# Patient Record
Sex: Female | Born: 1965 | Race: White | Hispanic: No | Marital: Married | Smoking: Never smoker
Health system: Southern US, Community
[De-identification: ages and names within clinical notes are randomized; demographics above are authoritative.]

## PROBLEM LIST (undated history)

## (undated) DIAGNOSIS — G2581 Restless legs syndrome: Secondary | ICD-10-CM

## (undated) DIAGNOSIS — I10 Essential (primary) hypertension: Secondary | ICD-10-CM

## (undated) DIAGNOSIS — I493 Ventricular premature depolarization: Secondary | ICD-10-CM

## (undated) DIAGNOSIS — Z8742 Personal history of other diseases of the female genital tract: Secondary | ICD-10-CM

## (undated) DIAGNOSIS — G43909 Migraine, unspecified, not intractable, without status migrainosus: Secondary | ICD-10-CM

## (undated) DIAGNOSIS — M1710 Unilateral primary osteoarthritis, unspecified knee: Secondary | ICD-10-CM

## (undated) DIAGNOSIS — S43439A Superior glenoid labrum lesion of unspecified shoulder, initial encounter: Secondary | ICD-10-CM

## (undated) DIAGNOSIS — K5792 Diverticulitis of intestine, part unspecified, without perforation or abscess without bleeding: Secondary | ICD-10-CM

## (undated) DIAGNOSIS — Z973 Presence of spectacles and contact lenses: Secondary | ICD-10-CM

## (undated) DIAGNOSIS — K219 Gastro-esophageal reflux disease without esophagitis: Secondary | ICD-10-CM

## (undated) DIAGNOSIS — M7502 Adhesive capsulitis of left shoulder: Secondary | ICD-10-CM

## (undated) DIAGNOSIS — M179 Osteoarthritis of knee, unspecified: Secondary | ICD-10-CM

## (undated) DIAGNOSIS — R51 Headache: Secondary | ICD-10-CM

## (undated) HISTORY — PX: FOOT SURGERY: SHX648

## (undated) HISTORY — DX: Headache: R51

## (undated) HISTORY — DX: Essential (primary) hypertension: I10

---

## 2002-07-29 HISTORY — PX: SHOULDER ARTHROSCOPY W/ LABRAL REPAIR: SHX2399

## 2004-10-12 ENCOUNTER — Other Ambulatory Visit: Admission: RE | Admit: 2004-10-12 | Discharge: 2004-10-12 | Payer: Self-pay | Admitting: Family Medicine

## 2006-07-29 HISTORY — PX: LAPAROSCOPIC CHOLECYSTECTOMY: SUR755

## 2007-01-27 ENCOUNTER — Ambulatory Visit (HOSPITAL_COMMUNITY): Admission: RE | Admit: 2007-01-27 | Discharge: 2007-01-27 | Payer: Self-pay | Admitting: Family Medicine

## 2010-06-15 ENCOUNTER — Encounter: Admission: RE | Admit: 2010-06-15 | Discharge: 2010-06-15 | Payer: Self-pay | Admitting: Obstetrics & Gynecology

## 2010-06-26 ENCOUNTER — Encounter: Admission: RE | Admit: 2010-06-26 | Discharge: 2010-06-26 | Payer: Self-pay | Admitting: Obstetrics & Gynecology

## 2010-08-18 ENCOUNTER — Encounter: Payer: Self-pay | Admitting: Obstetrics & Gynecology

## 2010-08-20 ENCOUNTER — Emergency Department (HOSPITAL_BASED_OUTPATIENT_CLINIC_OR_DEPARTMENT_OTHER)
Admission: EM | Admit: 2010-08-20 | Discharge: 2010-08-20 | Payer: Self-pay | Source: Home / Self Care | Admitting: Emergency Medicine

## 2011-05-23 ENCOUNTER — Ambulatory Visit: Payer: BC Managed Care – PPO | Attending: Gynecologic Oncology | Admitting: Gynecologic Oncology

## 2011-05-23 DIAGNOSIS — E785 Hyperlipidemia, unspecified: Secondary | ICD-10-CM | POA: Insufficient documentation

## 2011-05-23 DIAGNOSIS — Z801 Family history of malignant neoplasm of trachea, bronchus and lung: Secondary | ICD-10-CM | POA: Insufficient documentation

## 2011-05-23 DIAGNOSIS — I10 Essential (primary) hypertension: Secondary | ICD-10-CM | POA: Insufficient documentation

## 2011-05-23 DIAGNOSIS — Z808 Family history of malignant neoplasm of other organs or systems: Secondary | ICD-10-CM | POA: Insufficient documentation

## 2011-05-23 DIAGNOSIS — N9489 Other specified conditions associated with female genital organs and menstrual cycle: Secondary | ICD-10-CM | POA: Insufficient documentation

## 2011-05-23 DIAGNOSIS — Z79899 Other long term (current) drug therapy: Secondary | ICD-10-CM | POA: Insufficient documentation

## 2011-05-24 NOTE — Consult Note (Signed)
Carol Hamilton, Carol Hamilton NO.:  [id number]  MEDICAL RECORD NO.:  [id number]  LOCATION:  GYN                          FACILITY:  [facility]  PHYSICIAN:  Laurette Schimke, MD     DATE OF BIRTH:  [birth year]  DATE OF CONSULTATION:  05/23/2011 DATE OF DISCHARGE:                                CONSULTATION   REASON FOR VISIT:  Left ovarian cyst.  HISTORY OF PRESENT ILLNESS:  This is a 45 year old, gravida 2, para 2, last normal menstrual period on April 29, 2011.  In 2001, she presented with significant abdominal pain and evaluation at that time was diagnosis of likely serosal fibroid.  At that time, the left ovary was noted to have an 11 x 10 mm smooth cyst and a 14 x 9 mm cyst with low- level echoes.  Repeat ultrasound on August 29, 2010, demonstrated a 12 x 14 x 15 mm left ovarian cyst with slight thickening echoes.  On April 24, 2011, repeat ultrasound of the same left ovary was notable for left adnexa measuring 31 x 38 x 41 mm, 29 x 22 x 25 mm smooth cyst with septations.  CA-125 returned a value of 24.5.  Carol Hamilton denies any nausea or vomiting, reports diarrhea for 7 months duration with some constipation over the last few months.  She reports good appetite.  No weight loss.  No hematochezia or abnormal uterine bleeding.  PAST MEDICAL HISTORY: 1. Hypertension for 10 years. 2. Premature ventricular contractions. 3. Hyperlipidemia, diagnosed 3 years ago. 4. Questionable trigeminal neuralgia, repeated with nonspecific workup     for MS.  PAST SURGICAL HISTORY:  Right shoulder surgery in 2003 and cholecystectomy in 2009.  PAST GYN HISTORY:  Menarche occurred at age of 36 with regular menses every 32 days.  She denies any history of abnormal Pap test.  She used oral contraceptive pills for 2 years in the early part of her sexually active life.  Her husband has had a vasectomy.  MEDICATIONS: 1. Omeprazole 40 mg daily. 2. Lisinopril and hydrochlorothiazide  10/12.5 mg daily. 3. Acebutolol 200 mg daily.  ALLERGIES:  AMOXICILLIN that causes emesis.  SULFA drug causes hives. ERYTHROMYCIN that causes hives and shortness of breath.  FAMILY HISTORY:  Paternal grandmother with lung cancer, diagnosed at age of 67 and a father with melanoma, diagnosed in the 52s.  REVIEW OF SYSTEMS:  No nausea, vomiting.  No hematuria, hematochezia, or hematuria.  Denies early satiety, increasing abdominal girth.  No abdominal distention or weight loss.  Otherwise, 10 point review of systems is negative.  PHYSICAL EXAMINATION:  GENERAL:  Well-developed female, in no acute distress.  VITAL SIGNS:  Weight 189.7 pounds, height 5 feet 9 inches, blood pressure 142/78, heart rate of 82, temperature of 98.4. CHEST:  Clear to auscultation. HEART:  Regular rate and rhythm. ABDOMEN:  Soft, nontender, obese without any palpable omental cake or palpable fluid wave. BACK:  No CVA tenderness. LYMPH NODE SURVEY:  No cervical, supraclavicular, inguinal adenopathy. PELVIC EXAMINATION:  Normal external genitalia, Bartholin's, urethra, and Skene's.  Uterus, cervix without any masses.  Uterus is mobile, retroverted.  No nodularity in the cul-de-sac.  No palpable pelvic masses.  No left  or right-sided tenderness. RECTAL EXAMINATION:  Good anal sphincter tone without any masses.  IMPRESSION:  Carol Hamilton is a 45 year old with a persistent left adnexal mass, slowly increasing in size over the last year.  She has normal CA-125.  This likely represents a benign neoplasm or an endometrioma.  I have recommended bilateral salpingectomy and left oophorectomy.  Carol Hamilton informed me that Dr. Leda Quail has requested that it be performed by the GYN Oncology Service.  As such, procedure will be performed on June 04, 2011.  Robotic approach will be taken to facilitate easy removal of the contralateral fallopian tube. A frozen section will be obtained, if malignancy is  identified, patient understands it will be an open laparotomy with completed staging inclusive, but not limited to omentectomy, lymph node dissection, bowel resection, or debulking.  Risks and benefits of the procedure were discussed with the patient and her husband and all of their questions were answered.     Laurette Schimke, MD     WB/MEDQ  D:  05/23/2011  T:  05/24/2011  Job:  [id number]  cc:   Telford Nab, R.N. [address] [place], [place] [address]  Joycelyn Rua, M.D. Fax: [phone]  Kerney Elbe, MD [facility]  Lum Keas, MD  Electronically Signed by Laurette Schimke MD on 05/24/2011 11:10:34 AM

## 2011-06-04 ENCOUNTER — Encounter (HOSPITAL_COMMUNITY): Payer: Self-pay | Admitting: Pharmacy Technician

## 2011-06-07 ENCOUNTER — Encounter (HOSPITAL_COMMUNITY): Payer: BC Managed Care – PPO

## 2011-06-07 ENCOUNTER — Encounter (HOSPITAL_COMMUNITY): Payer: Self-pay

## 2011-06-07 ENCOUNTER — Ambulatory Visit (HOSPITAL_COMMUNITY)
Admission: RE | Admit: 2011-06-07 | Discharge: 2011-06-07 | Disposition: A | Discharge status: Home / Self Care | Payer: BC Managed Care – PPO | Source: Ambulatory Visit | Attending: Gynecologic Oncology | Admitting: Gynecologic Oncology

## 2011-06-07 ENCOUNTER — Other Ambulatory Visit: Payer: Self-pay

## 2011-06-07 DIAGNOSIS — Z01818 Encounter for other preprocedural examination: Secondary | ICD-10-CM | POA: Insufficient documentation

## 2011-06-07 DIAGNOSIS — I10 Essential (primary) hypertension: Secondary | ICD-10-CM | POA: Insufficient documentation

## 2011-06-07 DIAGNOSIS — R9431 Abnormal electrocardiogram [ECG] [EKG]: Secondary | ICD-10-CM | POA: Insufficient documentation

## 2011-06-07 DIAGNOSIS — Z01810 Encounter for preprocedural cardiovascular examination: Secondary | ICD-10-CM | POA: Insufficient documentation

## 2011-06-07 DIAGNOSIS — Z01812 Encounter for preprocedural laboratory examination: Secondary | ICD-10-CM | POA: Insufficient documentation

## 2011-06-07 LAB — CBC
HCT: 38 % (ref 36.0–46.0)
MCH: 31.3 pg (ref 26.0–34.0)
MCHC: 34.2 g/dL (ref 30.0–36.0)
MCV: 91.6 fL (ref 78.0–100.0)
Platelets: 328 10*3/uL (ref 150–400)
RDW: 12.4 % (ref 11.5–15.5)
WBC: 9.4 10*3/uL (ref 4.0–10.5)

## 2011-06-07 LAB — COMPREHENSIVE METABOLIC PANEL
AST: 16 U/L (ref 0–37)
Albumin: 4.5 g/dL (ref 3.5–5.2)
BUN: 13 mg/dL (ref 6–23)
Calcium: 10.5 mg/dL (ref 8.4–10.5)
Creatinine, Ser: 0.69 mg/dL (ref 0.50–1.10)
Total Bilirubin: 0.5 mg/dL (ref 0.3–1.2)
Total Protein: 7.6 g/dL (ref 6.0–8.3)

## 2011-06-07 LAB — DIFFERENTIAL
Basophils Relative: 0 % (ref 0–1)
Lymphocytes Relative: 27 % (ref 12–46)
Monocytes Absolute: 0.8 10*3/uL (ref 0.1–1.0)
Monocytes Relative: 8 % (ref 3–12)
Neutro Abs: 6 10*3/uL (ref 1.7–7.7)
Neutrophils Relative %: 64 % (ref 43–77)

## 2011-06-07 NOTE — Patient Instructions (Addendum)
20 Carol Hamilton  06/07/2011   Your procedure is scheduled on: 06-11-11  Report to [facility] at 12:00 noon.  Call this number if you have problems the morning of surgery: [phone]   Remember:   Do not eat food:After Midnight.06-10-11  Do not drink clear liquids: 6 Hours before arrival.(clear liquids until 08:00am 06-11-11  Take these medicines the morning of surgery with A SIP OF WATER: none-Take Acebutelol the night before at bedtime.  Do not wear jewelry, make-up or nail polish.  Do not wear lotions, powders, or perfumes. You may wear deodorant.  Do not shave 48 hours prior to surgery.  Do not bring valuables to the hospital.  Contacts, dentures or bridgework may not be worn into surgery.  Leave suitcase in the car. After surgery it may be brought to your room.  For patients admitted to the hospital, checkout time is 11:00 AM the day of discharge.   Patients discharged the day of surgery will not be allowed to drive home.  Name and phone number of your driver: Henslee Lottman [phone]  Special Instructions: CHG Shower Use Special Wash: 1/2 bottle night before surgery and 1/2 bottle morning of surgery.   Please read over the following fact sheets that you were given: Blood Transfusion Information and MRSA Information. Follow Clear Liquids X 24 hours prior to surgery.

## 2011-06-07 NOTE — Pre-Procedure Instructions (Signed)
06-07-11-Reports not yet received from [facility] for EKG. EKG and CXR done today. Pt. Aware clot/hold to be done Am of surgery.

## 2011-06-11 ENCOUNTER — Encounter (HOSPITAL_COMMUNITY): Payer: Self-pay | Admitting: *Deleted

## 2011-06-11 ENCOUNTER — Encounter (HOSPITAL_COMMUNITY): Payer: Self-pay | Admitting: Certified Registered Nurse Anesthetist

## 2011-06-11 ENCOUNTER — Ambulatory Visit (HOSPITAL_COMMUNITY)
Admission: RE | Admit: 2011-06-11 | Discharge: 2011-06-11 | Disposition: A | Discharge status: Home / Self Care | Payer: BC Managed Care – PPO | Source: Ambulatory Visit | Attending: Obstetrics & Gynecology | Admitting: Obstetrics & Gynecology

## 2011-06-11 ENCOUNTER — Encounter (HOSPITAL_COMMUNITY)
Admission: RE | Disposition: A | Discharge status: Home / Self Care | Payer: Self-pay | Source: Ambulatory Visit | Attending: Obstetrics & Gynecology

## 2011-06-11 ENCOUNTER — Ambulatory Visit (HOSPITAL_COMMUNITY): Payer: BC Managed Care – PPO | Admitting: Certified Registered Nurse Anesthetist

## 2011-06-11 ENCOUNTER — Other Ambulatory Visit: Payer: Self-pay | Admitting: Gynecologic Oncology

## 2011-06-11 DIAGNOSIS — I10 Essential (primary) hypertension: Secondary | ICD-10-CM | POA: Insufficient documentation

## 2011-06-11 DIAGNOSIS — E785 Hyperlipidemia, unspecified: Secondary | ICD-10-CM | POA: Insufficient documentation

## 2011-06-11 DIAGNOSIS — I4949 Other premature depolarization: Secondary | ICD-10-CM | POA: Insufficient documentation

## 2011-06-11 DIAGNOSIS — N839 Noninflammatory disorder of ovary, fallopian tube and broad ligament, unspecified: Secondary | ICD-10-CM | POA: Insufficient documentation

## 2011-06-11 DIAGNOSIS — N801 Endometriosis of ovary: Secondary | ICD-10-CM | POA: Insufficient documentation

## 2011-06-11 DIAGNOSIS — N8000 Endometriosis of the uterus, unspecified: Secondary | ICD-10-CM | POA: Insufficient documentation

## 2011-06-11 DIAGNOSIS — Z79899 Other long term (current) drug therapy: Secondary | ICD-10-CM | POA: Insufficient documentation

## 2011-06-11 DIAGNOSIS — K660 Peritoneal adhesions (postprocedural) (postinfection): Secondary | ICD-10-CM | POA: Insufficient documentation

## 2011-06-11 DIAGNOSIS — N80109 Endometriosis of ovary, unspecified side, unspecified depth: Secondary | ICD-10-CM | POA: Insufficient documentation

## 2011-06-11 HISTORY — PX: OTHER SURGICAL HISTORY: SHX169

## 2011-06-11 LAB — SAMPLE TO BLOOD BANK

## 2011-06-11 SURGERY — ROBOTIC ASSISTED BILATERAL SALPINGO OOPHORECTOMY
Anesthesia: General | Site: Abdomen | Laterality: Right | Wound class: Clean

## 2011-06-11 MED ORDER — GLYCOPYRROLATE 0.2 MG/ML IJ SOLN
INTRAMUSCULAR | Status: DC | PRN
  Administered 2011-06-11: .5 mg via INTRAVENOUS

## 2011-06-11 MED ORDER — LACTATED RINGERS IV SOLN
INTRAVENOUS | Status: DC | PRN
  Administered 2011-06-11 (×2): via INTRAVENOUS

## 2011-06-11 MED ORDER — LACTATED RINGERS IV SOLN: INTRAVENOUS | Status: DC

## 2011-06-11 MED ORDER — STERILE WATER FOR IRRIGATION IR SOLN
Status: DC | PRN
  Administered 2011-06-11: 2000 mL

## 2011-06-11 MED ORDER — HYDROMORPHONE HCL PF 1 MG/ML IJ SOLN
INTRAMUSCULAR | Status: DC | PRN
  Administered 2011-06-11 (×4): .4 mg via INTRAVENOUS

## 2011-06-11 MED ORDER — OXYCODONE-ACETAMINOPHEN 5-325 MG PO TABS: 2.0000 | ORAL_TABLET | Freq: Four times a day (QID) | ORAL | Status: AC | PRN

## 2011-06-11 MED ORDER — LACTATED RINGERS IR SOLN
Status: DC | PRN
  Administered 2011-06-11: 1000 mL

## 2011-06-11 MED ORDER — LIDOCAINE HCL (CARDIAC) 20 MG/ML IV SOLN
INTRAVENOUS | Status: DC | PRN
  Administered 2011-06-11: 80 mg via INTRAVENOUS

## 2011-06-11 MED ORDER — MIDAZOLAM HCL 5 MG/5ML IJ SOLN
INTRAMUSCULAR | Status: DC | PRN
  Administered 2011-06-11: 2 mg via INTRAVENOUS

## 2011-06-11 MED ORDER — ROCURONIUM BROMIDE 100 MG/10ML IV SOLN
INTRAVENOUS | Status: DC | PRN
  Administered 2011-06-11: 40 mg via INTRAVENOUS
  Administered 2011-06-11: 10 mg via INTRAVENOUS

## 2011-06-11 MED ORDER — DEXAMETHASONE SODIUM PHOSPHATE 10 MG/ML IJ SOLN
INTRAMUSCULAR | Status: DC | PRN
  Administered 2011-06-11: 10 mg via INTRAVENOUS

## 2011-06-11 MED ORDER — HYDROMORPHONE HCL PF 1 MG/ML IJ SOLN
0.2500 mg | INTRAMUSCULAR | Status: DC | PRN
  Administered 2011-06-11: 0.5 mg via INTRAVENOUS

## 2011-06-11 MED ORDER — ACETAMINOPHEN 10 MG/ML IV SOLN
INTRAVENOUS | Status: DC | PRN
  Administered 2011-06-11: 1000 mg via INTRAVENOUS

## 2011-06-11 MED ORDER — NEOSTIGMINE METHYLSULFATE 1 MG/ML IJ SOLN
INTRAMUSCULAR | Status: DC | PRN
  Administered 2011-06-11: 3.5 mg via INTRAVENOUS

## 2011-06-11 MED ORDER — PROPOFOL 10 MG/ML IV EMUL
INTRAVENOUS | Status: DC | PRN
  Administered 2011-06-11: 150 mg via INTRAVENOUS

## 2011-06-11 MED ORDER — ONDANSETRON HCL 4 MG/2ML IJ SOLN
INTRAMUSCULAR | Status: DC | PRN
  Administered 2011-06-11: 4 mg via INTRAVENOUS

## 2011-06-11 MED ORDER — PROMETHAZINE HCL 25 MG/ML IJ SOLN
6.2500 mg | INTRAMUSCULAR | Status: DC | PRN
  Administered 2011-06-11: 6.25 mg via INTRAVENOUS

## 2011-06-11 MED ORDER — DEXTROSE 5 % IV SOLN
1.0000 g | INTRAVENOUS | Status: DC | PRN
  Administered 2011-06-11: 2 g via INTRAVENOUS

## 2011-06-11 MED ORDER — FENTANYL CITRATE 0.05 MG/ML IJ SOLN
INTRAMUSCULAR | Status: DC | PRN
  Administered 2011-06-11: 50 ug via INTRAVENOUS
  Administered 2011-06-11: 100 ug via INTRAVENOUS
  Administered 2011-06-11 (×2): 50 ug via INTRAVENOUS

## 2011-06-11 SURGICAL SUPPLY — 55 items
APL SKNCLS STERI-STRIP NONHPOA (GAUZE/BANDAGES/DRESSINGS) ×2
BAG SPEC RTRVL LRG 6X4 10 (ENDOMECHANICALS) ×2
BENZOIN TINCTURE PRP APPL 2/3 (GAUZE/BANDAGES/DRESSINGS) ×3 IMPLANT
CHLORAPREP W/TINT 26ML (MISCELLANEOUS) ×3 IMPLANT
CLOTH BEACON ORANGE TIMEOUT ST (SAFETY) ×3 IMPLANT
CORDS BIPOLAR (ELECTRODE) ×3 IMPLANT
COVER MAYO STAND STRL (DRAPES) ×3 IMPLANT
COVER SURGICAL LIGHT HANDLE (MISCELLANEOUS) ×3 IMPLANT
COVER TIP SHEARS 8 DVNC (MISCELLANEOUS) ×2 IMPLANT
COVER TIP SHEARS 8MM DA VINCI (MISCELLANEOUS) ×1
DECANTER SPIKE VIAL GLASS SM (MISCELLANEOUS) ×2 IMPLANT
DRAPE LG THREE QUARTER DISP (DRAPES) ×6 IMPLANT
DRAPE SURG IRRIG POUCH 19X23 (DRAPES) ×3 IMPLANT
DRAPE TABLE BACK 44X90 PK DISP (DRAPES) ×5 IMPLANT
DRAPE UTILITY XL STRL (DRAPES) ×3 IMPLANT
DRAPE WARM FLUID 44X44 (DRAPE) ×3 IMPLANT
DRSG TEGADERM 6X8 (GAUZE/BANDAGES/DRESSINGS) ×6 IMPLANT
ELECT REM PT RETURN 9FT ADLT (ELECTROSURGICAL) ×3
ELECTRODE REM PT RTRN 9FT ADLT (ELECTROSURGICAL) ×2 IMPLANT
GAUZE VASELINE 3X9 (GAUZE/BANDAGES/DRESSINGS) IMPLANT
GLOVE BIO SURGEON STRL SZ 6.5 (GLOVE) ×12 IMPLANT
GLOVE BIO SURGEON STRL SZ7.5 (GLOVE) ×6 IMPLANT
GLOVE BIOGEL PI IND STRL 7.0 (GLOVE) ×4 IMPLANT
GLOVE BIOGEL PI INDICATOR 7.0 (GLOVE) ×2
GOWN PREVENTION PLUS XLARGE (GOWN DISPOSABLE) ×15 IMPLANT
HOLDER FOLEY CATH W/STRAP (MISCELLANEOUS) ×3 IMPLANT
KIT ACCESSORY DA VINCI DISP (KITS) ×1
KIT ACCESSORY DVNC DISP (KITS) ×2 IMPLANT
LUBRICANT JELLY K Y 4OZ (MISCELLANEOUS) ×3 IMPLANT
MANIPULATOR UTERINE 4.5 ZUMI (MISCELLANEOUS) ×2 IMPLANT
OCCLUDER COLPOPNEUMO (BALLOONS) ×2 IMPLANT
PACK LAPAROSCOPY W LONG (CUSTOM PROCEDURE TRAY) ×3 IMPLANT
PENCIL BUTTON HOLSTER BLD 10FT (ELECTRODE) ×2 IMPLANT
POSITIONER SURGICAL ARM (MISCELLANEOUS) ×6 IMPLANT
POUCH SPECIMEN RETRIEVAL 10MM (ENDOMECHANICALS) ×5 IMPLANT
SET TUBE IRRIG SUCTION NO TIP (IRRIGATION / IRRIGATOR) ×3 IMPLANT
SHEET LAVH (DRAPES) ×3 IMPLANT
SOLUTION ELECTROLUBE (MISCELLANEOUS) ×3 IMPLANT
SPONGE LAP 18X18 X RAY DECT (DISPOSABLE) IMPLANT
STRIP CLOSURE SKIN 1/2X4 (GAUZE/BANDAGES/DRESSINGS) ×3 IMPLANT
SUT VIC AB 0 CT1 27 (SUTURE)
SUT VIC AB 0 CT1 27XBRD ANTBC (SUTURE) ×6 IMPLANT
SUT VIC AB 4-0 PS2 27 (SUTURE) ×6 IMPLANT
SUT VICRYL 0 UR6 27IN ABS (SUTURE) ×5 IMPLANT
SYR 50ML LL SCALE MARK (SYRINGE) ×3 IMPLANT
SYR BULB IRRIGATION 50ML (SYRINGE) IMPLANT
SYRINGE 10CC LL (SYRINGE) ×6 IMPLANT
TOWEL OR 17X26 10 PK STRL BLUE (TOWEL DISPOSABLE) ×8 IMPLANT
TRAP SPECIMEN MUCOUS 40CC (MISCELLANEOUS) ×3 IMPLANT
TRAY FOLEY CATH 14FRSI W/METER (CATHETERS) ×3 IMPLANT
TROCAR 12M 150ML BLUNT (TROCAR) ×3 IMPLANT
TROCAR BLADELESS OPT 5 100 (ENDOMECHANICALS) ×3 IMPLANT
TROCAR XCEL 12X100 BLDLESS (ENDOMECHANICALS) ×3 IMPLANT
TUBING FILTER THERMOFLATOR (ELECTROSURGICAL) ×3 IMPLANT
WATER STERILE IRR 1500ML POUR (IV SOLUTION) ×6 IMPLANT

## 2011-06-11 NOTE — Anesthesia Preprocedure Evaluation (Addendum)
Anesthesia Evaluation  Patient identified by MRN, date of birth, ID band Patient awake    Reviewed: Allergy & Precautions, H&P , NPO status , Patient's Chart, lab work & pertinent test results, reviewed documented beta blocker date and time   Airway Mallampati: II TM Distance: >3 FB Neck ROM: Full    Dental   Pulmonary neg pulmonary ROS,  clear to auscultation        Cardiovascular hypertension, Pt. on medications Regular Normal Denies cardiac symptoms, except for occasional PVC   Neuro/Psych Negative Neurological ROS  Negative Psych ROS   GI/Hepatic negative GI ROS, Neg liver ROS,   Endo/Other  Negative Endocrine ROS  Renal/GU negative Renal ROS  Genitourinary negative   Musculoskeletal negative musculoskeletal ROS (+)   Abdominal   Peds negative pediatric ROS (+)  Hematology negative hematology ROS (+)   Anesthesia Other Findings   Reproductive/Obstetrics negative OB ROS                          Anesthesia Physical Anesthesia Plan  ASA: II  Anesthesia Plan: General   Post-op Pain Management:    Induction:   Airway Management Planned: Oral ETT  Additional Equipment:   Intra-op Plan:   Post-operative Plan: Extubation in OR  Informed Consent: I have reviewed the patients History and Physical, chart, labs and discussed the procedure including the risks, benefits and alternatives for the proposed anesthesia with the patient or authorized representative who has indicated his/her understanding and acceptance.     Plan Discussed with: CRNA and Surgeon  Anesthesia Plan Comments:         Anesthesia Quick Evaluation

## 2011-06-11 NOTE — Preoperative (Addendum)
Beta Blockers   Reason not to administer Beta Blockers:  Pt took Sectral 06-10-11 at 2100

## 2011-06-11 NOTE — Anesthesia Postprocedure Evaluation (Signed)
  Anesthesia Post-op Note  Patient: Carol Hamilton  Procedure(s) Performed:  ROBOTIC ASSISTED SALPINGO OOPHERECTOMY; UNILATERAL SALPINGECTOMY  Patient Location: PACU  Anesthesia Type: General  Level of Consciousness: oriented and sedated  Airway and Oxygen Therapy: Patient Spontanous Breathing and Patient connected to nasal cannula oxygen  Post-op Pain: mild  Post-op Assessment: Post-op Vital signs reviewed, Patient's Cardiovascular Status Stable, Respiratory Function Stable and Patent Airway  Post-op Vital Signs: stable  Complications: No apparent anesthesia complications

## 2011-06-11 NOTE — H&P (Signed)
NAMEBRIXTON, Carol Hamilton NO.: [id number]  MEDICAL RECORD NO.: [id number]  LOCATION: GYN FACILITY: [facility]  DATE OF BIRTH: 1966/06/18    CONSULTATION  REASON FOR VISIT: Left ovarian cyst.  HISTORY OF PRESENT ILLNESS: This is a 45 year old, gravida 2, para 2,  last normal menstrual period on April 29, 2011. In 2001, she presented  with significant abdominal pain and evaluation at that time was  diagnosis of likely serosal fibroid. At that time, the left ovary was  noted to have an 11 x 10 mm smooth cyst and a 14 x 9 mm cyst with low-  level echoes. Repeat ultrasound on August 29, 2010, demonstrated a 12  x 14 x 15 mm left ovarian cyst with slight thickening echoes. On  April 24, 2011, repeat ultrasound of the same left ovary was notable  for left adnexa measuring 31 x 38 x 41 mm, 29 x 22 x 25 mm smooth cyst  with septations. CA-125 returned a value of 24.5. Carol Hamilton denies  any nausea or vomiting, reports diarrhea for 7 months duration with some  constipation over the last few months. She reports good appetite. No  weight loss. No hematochezia or abnormal uterine bleeding.   PAST MEDICAL HISTORY:  1. Hypertension for 10 years.  2. Premature ventricular contractions.  3. Hyperlipidemia, diagnosed 3 years ago.  4. Questionable trigeminal neuralgia, repeated with nonspecific workup  for MS.   PAST SURGICAL HISTORY: Right shoulder surgery in 2003 and  cholecystectomy in 2009.   PAST GYN HISTORY: Menarche occurred at age of 45 with regular menses  every 32 days. She denies any history of abnormal Pap test. She used  oral contraceptive pills for 2 years in the early part of her sexually  active life. Her husband has had a vasectomy.   MEDICATIONS:  1. Omeprazole 40 mg daily.  2. Lisinopril and hydrochlorothiazide 10/12.5 mg daily.  3. Acebutolol 200 mg daily.   ALLERGIES: AMOXICILLIN that causes emesis. SULFA drug causes hives.  ERYTHROMYCIN that causes hives and  shortness of breath.   FAMILY HISTORY: Paternal grandmother with lung cancer, diagnosed at age  of 55 and a father with melanoma, diagnosed in the 15s.   REVIEW OF SYSTEMS: No nausea, vomiting. No hematuria, hematochezia, or  hematuria. Denies early satiety, increasing abdominal girth. No  abdominal distention or weight loss. Otherwise, 10 point review of  systems is negative.   IMPRESSION: Carol Hamilton is a 45 year old with a persistent  left adnexal mass, slowly increasing in size over the last year. She  has normal CA-125. This likely represents a benign neoplasm or an  endometrioma.Robotic approach will  be taken to facilitate easy removal of the contralateral fallopian tube.  A frozen section will be obtained, if malignancy is identified,  patient understands it will be an open laparotomy with completed staging  inclusive, but not limited to omentectomy, lymph node dissection, bowel  resection, or debulking. Risks and benefits of the procedure were  discussed with the patient and her husband and all of their questions  were answered.    Addendum: Patient seen and examined.  All questions were answered.  Patient understands the most usual risks and benefits of the procedure and wishes to proceed.

## 2011-06-11 NOTE — Transfer of Care (Signed)
Immediate Anesthesia Transfer of Care Note  Patient: Carol Hamilton  Procedure(s) Performed:  ROBOTIC ASSISTED SALPINGO OOPHERECTOMY; UNILATERAL SALPINGECTOMY  Patient Location: PACU  Anesthesia Type: General  Level of Consciousness: alert  and sedated  Airway & Oxygen Therapy: Patient Spontanous Breathing and Patient connected to face mask oxygen  Post-op Assessment: Report given to PACU RN and Post -op Vital signs reviewed and stable  Post vital signs: Reviewed and stable  Complications: No apparent anesthesia complications

## 2011-06-11 NOTE — Anesthesia Procedure Notes (Addendum)
Spinal   Spinal    

## 2011-06-11 NOTE — Interval H&P Note (Signed)
History and Physical Interval Note:   06/11/2011   1:00 PM   Carol Hamilton  has presented today for surgery, with the diagnosis of Left Axilla Mass  The various methods of treatment have been discussed with the patient and family. After consideration of risks, benefits and other options for treatment, the patient has consented to  Procedure(s): ROBOTIC ASSISTED SALPINGO OOPHERECTOMY UNILATERAL SALPINGECTOMY as a surgical intervention .  The patients' history has been reviewed, patient examined, no change in status, stable for surgery.  I have reviewed the patients' chart and labs.  Questions were answered to the patient's satisfaction.     Cleda Mccreedy A.  MD

## 2011-06-11 NOTE — Op Note (Signed)
PATIENT: Carol Hamilton DATE OF BIRTH: 03-17-1966 ENCOUNTER DATE: 06/11/11   Preop Diagnosis: Left adnexal mass  Postoperative Diagnosis: Endometrioma  Surgery: Left salpingo-oophorectomy, right salpingectomy  Surgeon's Santo Zahradnik A. Duard Brady, MD; Antionette Char, MD   Assistant Telford Nab   Anesthesia: General   Estimated blood loss: 50 ml   IVF: 1400 ml   Urine output: 100 ml   Complications: None  Pathology:  bilateral tubes and left ovary ovaries  Operative findings: Uterus with 2 cm myoma. Evidence of endometriosis with a left sided ovarian mass. 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 Frozen section revealed endometriosis.  Procedure: The patient was identified in the preoperative holding area. Informed consent was signed on the chart. Patient was seen history was reviewed and exam was performed.  The patient was then taken to the operating room and placed in the supine position with SCD hose on. General anesthesia was then induced without difficulty. She was then placed in the dorsolithotomy position. Her arms were tucked at her side with appropriate precautions on the gel pad. Shoulder blocks were placed with all of the appropriate precautions. A OG-tube was placed to suction. First timeout was performed to confirm the patient procedure antibiotic allergy status, estimated estimated blood loss and OR time. The perineum was then prepped in the usual fashion with Betadine. A 14 French Foley was inserted into the bladder under sterile conditions. A sterile speculum was placed in the vagina. The cervix was without lesions. The cervix was grasped with a single-tooth tenaculum and a Hulka tenaculum was placed. The abdomen was then prepped with 2 Chlor prep sponges per protocol.  Patient was then draped after the prep was dried. Second timeout was performed to confirm the above. After again confirming OG tube placement and it was to suction.   A stab-wound was  made in left upper quadrant 2 cm below the costal margin on the left in the midclavicular line. A 5 mm operative report was used to assure intra-abdominal placement. The abdomen was insufflated. At this point all points during the procedure the patient's intra-abdominal pressure was not increased over 15 mm of mercury. After insufflation was complete, the patient was placed in deep Trendelenburg position. 25 cm above the pubic symphysis that area was marked the camera port. Bilateral robotic ports were marked 10 cm from the midline incision at approximately 5 angle. Under direct visualization each of the trochars was placed into the abdomen. The small bowel was folded on its mesentery to allow visualization to the pelvis. After assuring adequate visualization, the robot was then talked in the usual fashion. Under direct visualization the robotic instruments replaced.  The adhesive disease of the left adnexa to the rectosigmoid colon was taken down using meticulous sharp dissection.   The posterior leaf of the broad ligament were then taken down in the usual fashion. The ureter was identified on the medial leaf of the broad ligament. A window was made between [facility] and the ureter. The IP was coagulated with bipolar cautery and transected. We then dissected the peritoneum off the ureter which was inferior to this area where the ovary was stuck to the ovarian fossa. The fallopian tube was coagulated and transected at the level of the cornea. The utero-ovarian was coagulated with bipolar cautery and transected. The left adnexa was then placed an Endo catch bag and delivered through the 10/12 left upper quadrant port. Our attention was then drawn to the right fallopian tube. A right salpingectomy was performed  by coming across any hydrosalpinx with monopolar bipolar cautery were necessary. The fallopian tube was then delivered without difficulty. Frozen section at this time returned as endometriosis. Based on our  preoperative discussions with the patient would terminate the procedure at this point the The abdomen and pelvis were copiously irrigated and noted to be hemostatic. The robotic instruments were removed under direct visualization as were the robotic trochars. The pneumoperitoneum was removed. The patient was then taken out of the Trendelenburg position. Using a 0 Vicryl on a UR 6 needle the midline port port fascia was closed  And the left upper quadrant ports were reapproximated. The skin was closed using 4-0 Vicryl. Steri-Strips and benzoin were applied. A Hulka tenaculum was then removed from the cervix.  All instrument needle and Ray-Tec counts were correct x2. The patient tolerated the procedure well and was taken to the recovery room in stable condition. This is Carol Hamilton dictating an operative note on patient Carol Hamilton.

## 2011-06-13 ENCOUNTER — Telehealth: Payer: Self-pay | Admitting: *Deleted

## 2011-06-17 NOTE — Telephone Encounter (Signed)
Opened in error

## 2011-07-10 ENCOUNTER — Encounter: Payer: Self-pay | Admitting: Gynecologic Oncology

## 2011-07-10 ENCOUNTER — Ambulatory Visit: Payer: BC Managed Care – PPO | Attending: Gynecologic Oncology | Admitting: Gynecologic Oncology

## 2011-07-10 VITALS — BP 130/86 | HR 64 | Temp 97.9°F | Resp 16 | Ht 70.28 in | Wt 190.8 lb

## 2011-07-10 DIAGNOSIS — I10 Essential (primary) hypertension: Secondary | ICD-10-CM | POA: Insufficient documentation

## 2011-07-10 DIAGNOSIS — N80109 Endometriosis of ovary, unspecified side, unspecified depth: Secondary | ICD-10-CM | POA: Insufficient documentation

## 2011-07-10 DIAGNOSIS — R1013 Epigastric pain: Secondary | ICD-10-CM | POA: Insufficient documentation

## 2011-07-10 DIAGNOSIS — Z79899 Other long term (current) drug therapy: Secondary | ICD-10-CM | POA: Insufficient documentation

## 2011-07-10 DIAGNOSIS — Z09 Encounter for follow-up examination after completed treatment for conditions other than malignant neoplasm: Secondary | ICD-10-CM | POA: Insufficient documentation

## 2011-07-10 DIAGNOSIS — N801 Endometriosis of ovary: Secondary | ICD-10-CM

## 2011-07-10 DIAGNOSIS — Z9079 Acquired absence of other genital organ(s): Secondary | ICD-10-CM | POA: Insufficient documentation

## 2011-07-10 NOTE — Patient Instructions (Signed)
Followup with Dr. Hyacinth Meeker and Dr. Izola Price when necessary

## 2011-07-10 NOTE — Progress Notes (Signed)
Consult Note: Gyn-Onc  Carol Hamilton 45 y.o. female  CC:  Chief Complaint  Patient presents with  . Follow-up    adnexal mass    HPI: December a pleasant 45 year old who was seen by Dr. Nelly Rout in consultation at the request of Dr. Hyacinth Meeker. She was noted to have a 12 x 14 x 15 mm left ovarian cyst with some slight thickening echoes on ultrasound in February of 2012. A repeat on the same left ovary was notable for left adnexa measuring 3.1 x 3.8 x 4.1 cm. The smooth cysts with septations. Her CA 125 is normal for 24.5. Patient at that time reported diarrhea for about 7 months duration with some constipation for the past several months. She reported a good appetite. She denies any weight loss. Discussion by Dr. Nelly Rout and the patient had been to proceed with bilateral salpingectomy and left oophorectomy. The gross to keep the contralateral ovary for all possible. The patient subsequently underwent surgery on November 13 of year 2012.  Operative findings included uterus with a 2 cm myoma. There is evidence of endometriosis with a left sided ovarian mass. 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 Frozen section revealed endometriosis. Her right ovary was unremarkable. Pathology revealed on the left side ovarian tissue of endometriosis and endometriotic cyst. There was associated hemorrhage and hemosiderin deposition. There was no evidence of atypia or malignancy. The bilateral fallopian tubes were negative. She comes in today for a postoperative check.  She states her cycles are regular she is on her. Now they're coming every 25 days. She does complain of some hot flashes with no night sweats. She states that prior to surgery she had an episode that when she would Valsalva to have a bowel movement she experienced severe epigastric pain. This has happened twice since his surgery. Her bowels have been irregular. It is primarily this pain when she is constipated she needs to  Valsalva. She did see the PA her primary care physician's office prior surgery for this in a probiotic was recommended as well as a stool softener.  She states that with this most recent episode she experienced a lot of burping she's not sure if she experienced perpendicular to episodes. She does not complain of any heartburn. She's wondering if she has a hiatal hernia. She continues to take her Prilosec.  Interval History: As above  Review of Systems  Current Meds:  Outpatient Encounter Prescriptions as of 07/10/2011  Medication Sig Dispense Refill  . acebutolol (SECTRAL) 200 MG capsule Take 200 mg by mouth at bedtime.       Marland Kitchen guaiFENesin (MUCINEX) 600 MG 12 hr tablet Take 1,200 mg by mouth 2 (two) times daily as needed. Congestion        . lisinopril-hydrochlorothiazide (PRINZIDE,ZESTORETIC) 10-12.5 MG per tablet Take 1 tablet by mouth at bedtime.       Marland Kitchen omeprazole (PRILOSEC) 20 MG capsule Take 20 mg by mouth daily.       . rizatriptan (MAXALT-MLT) 10 MG disintegrating tablet Take 10 mg by mouth as needed. May repeat in 2 hours if needed..headache         Allergy:  Allergies  Allergen Reactions  . Amoxicillin Nausea And Vomiting  . Azithromycin Swelling and Other (See Comments)    Skin peeled  . Sulfa Antibiotics Rash    Social Hx:   History   Social History  . Marital Status: Married    Spouse Name: N/A    Number of Children:  N/A  . Years of Education: N/A   Occupational History  . Not on file.   Social History Main Topics  . Smoking status: Never Smoker   . Smokeless tobacco: Not on file  . Alcohol Use: 0.6 oz/week    1 Glasses of wine per week  . Drug Use:   . Sexually Active: Yes     Spouse with Vasectomy   Other Topics Concern  . Not on file   Social History Narrative  . No narrative on file    Past Surgical Hx:  Past Surgical History  Procedure Date  . Cholecystectomy 06-07-11     [facility]ap.[facility]  . Shoulder arthroscopy w/ labral repair  06-07-11    [name]  . Foot surgery 06-07-11    FBO-removed needle left foot-child  . Salpingectomy     Bilat tubes and left ovary removal    Past Medical Hx:  Past Medical History  Diagnosis Date  . Dysrhythmia 06-07-11    Hx. freq. PVC's -tx. Acebuterol  . Heart murmur 06-07-11    child, not now  . Hypertension   . Headache 06-07-11    Migraines-more hormonal  . Depression 06-07-11    in past-no issues now.  . Neurologic complaint, functional 06-07-11    Past symptoms of left sided severe head pain/tingling-no true dx. given-/not occurred in some time now.    Family Hx:  Family History  Problem Relation Age of Onset  . Anesthesia problems Sister     Vitals:  Blood pressure 130/86, pulse 64, temperature 97.9 F (36.6 C), temperature source Oral, resp. rate 16, last menstrual period 07/07/2011.  Physical Exam:  Well-nourished well-developed female in no acute distress.  Abdomen: Well-healed surgical incisions. Abdomen is soft nontender nondistended there are no palpable masses or prostatomegaly.  Pelvic: Deferred at patient's request as she is on her period. Assessment/Plan: 45 year old with endometriotic cysts and she is status post left salpingo-oophorectomy and right salpingectomy. She is having some epigastric pain that occurs primarily when she has to Valsalva for bowel movement. She thinks she may have a hiatal hernia. She has not discussed this with her primary care physician as of yet. She did discuss some of the pain issues with his PA and they recommended a probiotic and a stool softener. She will take responsibility following up with Dr. Lenise Arena. Discussed that he may recommend a CT scan to evaluate for hiatal hernia. We do look at her chest x-ray that she had preoperatively and there is no large amount of stomach in her chest to make an obvious diagnosis. She also understands she may be referred to a gastroenterologist. We did discuss the question of whether  or not she was having some premature ovarian failure and a she's having quite a few hot flashes now but continues to have regular monthly menses. I discussed with her that should she need hormone replacement therapy the future as she still has her uterus she would need combination estrogen and progesterone. She states she cannot take birth control pills on secondary to migraines and she experienced when she was much younger. She will follow up on this with Dr. Hyacinth Meeker as needed. We also discussed that many women go through menopause without ever having hormone replacement therapy.  She will be released from our clinic she also be happy to see her in the future should the need arise  Marshawn Ninneman A., MD 07/10/2011, 5:05 PM

## 2011-07-30 HISTORY — PX: KNEE ARTHROSCOPY WITH ANTERIOR CRUCIATE LIGAMENT (ACL) REPAIR: SHX5644

## 2011-08-20 ENCOUNTER — Ambulatory Visit (HOSPITAL_BASED_OUTPATIENT_CLINIC_OR_DEPARTMENT_OTHER)
Admission: RE | Admit: 2011-08-20 | Discharge: 2011-08-20 | Disposition: A | Discharge status: Home / Self Care | Payer: BC Managed Care – PPO | Source: Ambulatory Visit | Attending: Family Medicine | Admitting: Family Medicine

## 2011-08-20 ENCOUNTER — Encounter (HOSPITAL_BASED_OUTPATIENT_CLINIC_OR_DEPARTMENT_OTHER): Payer: Self-pay

## 2011-08-20 ENCOUNTER — Other Ambulatory Visit (HOSPITAL_BASED_OUTPATIENT_CLINIC_OR_DEPARTMENT_OTHER): Payer: Self-pay | Admitting: Family Medicine

## 2011-08-20 DIAGNOSIS — D72829 Elevated white blood cell count, unspecified: Secondary | ICD-10-CM

## 2011-08-20 DIAGNOSIS — R109 Unspecified abdominal pain: Secondary | ICD-10-CM | POA: Insufficient documentation

## 2011-08-20 DIAGNOSIS — D259 Leiomyoma of uterus, unspecified: Secondary | ICD-10-CM | POA: Insufficient documentation

## 2011-08-20 DIAGNOSIS — R112 Nausea with vomiting, unspecified: Secondary | ICD-10-CM

## 2011-08-20 MED ORDER — IOHEXOL 300 MG/ML  SOLN
100.0000 mL | Freq: Once | INTRAMUSCULAR | Status: AC | PRN
  Administered 2011-08-20: 100 mL via INTRAVENOUS

## 2011-12-11 ENCOUNTER — Other Ambulatory Visit: Payer: Self-pay | Admitting: Gastroenterology

## 2013-12-14 ENCOUNTER — Encounter (HOSPITAL_BASED_OUTPATIENT_CLINIC_OR_DEPARTMENT_OTHER): Payer: Self-pay | Admitting: *Deleted

## 2013-12-14 NOTE — Progress Notes (Addendum)
NPO AFTER MN WITH EXCEPTION CLEAR LIQUIDS UNTIL 0715 (NO CREAM/ MILK PRODUCTS).  ARRIVE AT 1115. NEEDS ISTAT, URINE PREG., AND EKG. WILL TAKE PEPCID, SECTRAL AND DO FLONASE AM DOS W/ SIPS OF WATER.

## 2013-12-15 ENCOUNTER — Other Ambulatory Visit: Payer: Self-pay | Admitting: Orthopedic Surgery

## 2013-12-16 ENCOUNTER — Ambulatory Visit (HOSPITAL_BASED_OUTPATIENT_CLINIC_OR_DEPARTMENT_OTHER): Payer: 59 | Admitting: Certified Registered"

## 2013-12-16 ENCOUNTER — Encounter (HOSPITAL_BASED_OUTPATIENT_CLINIC_OR_DEPARTMENT_OTHER): Payer: Self-pay | Admitting: *Deleted

## 2013-12-16 ENCOUNTER — Encounter (HOSPITAL_BASED_OUTPATIENT_CLINIC_OR_DEPARTMENT_OTHER)
Admission: RE | Disposition: A | Discharge status: Home / Self Care | Payer: Self-pay | Source: Ambulatory Visit | Attending: Specialist

## 2013-12-16 ENCOUNTER — Ambulatory Visit (HOSPITAL_BASED_OUTPATIENT_CLINIC_OR_DEPARTMENT_OTHER)
Admission: RE | Admit: 2013-12-16 | Discharge: 2013-12-16 | Disposition: A | Discharge status: Home / Self Care | Payer: 59 | Source: Ambulatory Visit | Attending: Specialist | Admitting: Specialist

## 2013-12-16 ENCOUNTER — Other Ambulatory Visit: Payer: Self-pay

## 2013-12-16 DIAGNOSIS — X58XXXA Exposure to other specified factors, initial encounter: Secondary | ICD-10-CM | POA: Insufficient documentation

## 2013-12-16 DIAGNOSIS — M659 Unspecified synovitis and tenosynovitis, unspecified site: Secondary | ICD-10-CM | POA: Insufficient documentation

## 2013-12-16 DIAGNOSIS — M1710 Unilateral primary osteoarthritis, unspecified knee: Secondary | ICD-10-CM | POA: Insufficient documentation

## 2013-12-16 DIAGNOSIS — Z9889 Other specified postprocedural states: Secondary | ICD-10-CM

## 2013-12-16 DIAGNOSIS — Z79899 Other long term (current) drug therapy: Secondary | ICD-10-CM | POA: Insufficient documentation

## 2013-12-16 DIAGNOSIS — IMO0002 Reserved for concepts with insufficient information to code with codable children: Secondary | ICD-10-CM | POA: Insufficient documentation

## 2013-12-16 DIAGNOSIS — M795 Residual foreign body in soft tissue: Secondary | ICD-10-CM | POA: Insufficient documentation

## 2013-12-16 DIAGNOSIS — I4949 Other premature depolarization: Secondary | ICD-10-CM | POA: Insufficient documentation

## 2013-12-16 DIAGNOSIS — Z9079 Acquired absence of other genital organ(s): Secondary | ICD-10-CM | POA: Insufficient documentation

## 2013-12-16 DIAGNOSIS — K219 Gastro-esophageal reflux disease without esophagitis: Secondary | ICD-10-CM | POA: Insufficient documentation

## 2013-12-16 DIAGNOSIS — I10 Essential (primary) hypertension: Secondary | ICD-10-CM | POA: Insufficient documentation

## 2013-12-16 HISTORY — PX: KNEE ARTHROSCOPY WITH MEDIAL MENISECTOMY: SHX5651

## 2013-12-16 HISTORY — DX: Unilateral primary osteoarthritis, unspecified knee: M17.10

## 2013-12-16 HISTORY — DX: Presence of spectacles and contact lenses: Z97.3

## 2013-12-16 HISTORY — DX: Ventricular premature depolarization: I49.3

## 2013-12-16 HISTORY — DX: Personal history of other diseases of the female genital tract: Z87.42

## 2013-12-16 HISTORY — DX: Restless legs syndrome: G25.81

## 2013-12-16 HISTORY — DX: Osteoarthritis of knee, unspecified: M17.9

## 2013-12-16 HISTORY — DX: Gastro-esophageal reflux disease without esophagitis: K21.9

## 2013-12-16 LAB — POCT I-STAT 4, (NA,K, GLUC, HGB,HCT)
Glucose, Bld: 94 mg/dL (ref 70–99)
HCT: 38 % (ref 36.0–46.0)
Hemoglobin: 12.9 g/dL (ref 12.0–15.0)
Potassium: 4.3 meq/L (ref 3.7–5.3)
Sodium: 141 meq/L (ref 137–147)

## 2013-12-16 LAB — POCT PREGNANCY, URINE: PREG TEST UR: NEGATIVE

## 2013-12-16 SURGERY — ARTHROSCOPY, KNEE, WITH MEDIAL MENISCECTOMY
Anesthesia: General | Site: Knee | Laterality: Right

## 2013-12-16 MED ORDER — BUPIVACAINE HCL 0.25 % IJ SOLN
INTRAMUSCULAR | Status: DC | PRN
  Administered 2013-12-16: 10 mL

## 2013-12-16 MED ORDER — MORPHINE SULFATE 4 MG/ML IJ SOLN
INTRAMUSCULAR | Status: AC
  Filled 2013-12-16: qty 1

## 2013-12-16 MED ORDER — PROPOFOL 10 MG/ML IV BOLUS
INTRAVENOUS | Status: DC | PRN
  Administered 2013-12-16: 100 mg via INTRAVENOUS
  Administered 2013-12-16: 180 mg via INTRAVENOUS
  Administered 2013-12-16: 120 mg via INTRAVENOUS

## 2013-12-16 MED ORDER — CEFAZOLIN SODIUM-DEXTROSE 2-3 GM-% IV SOLR
2.0000 g | INTRAVENOUS | Status: AC
  Administered 2013-12-16: 2 g via INTRAVENOUS
  Filled 2013-12-16: qty 50

## 2013-12-16 MED ORDER — SODIUM CHLORIDE 0.9 % IV SOLN
INTRAVENOUS | Status: DC
  Filled 2013-12-16: qty 1000

## 2013-12-16 MED ORDER — LACTATED RINGERS IV SOLN
INTRAVENOUS | Status: DC
  Administered 2013-12-16: 12:00:00 via INTRAVENOUS
  Filled 2013-12-16: qty 1000

## 2013-12-16 MED ORDER — FENTANYL CITRATE 0.05 MG/ML IJ SOLN
INTRAMUSCULAR | Status: DC | PRN
  Administered 2013-12-16 (×2): 50 ug via INTRAVENOUS
  Administered 2013-12-16 (×2): 25 ug via INTRAVENOUS
  Administered 2013-12-16: 50 ug via INTRAVENOUS

## 2013-12-16 MED ORDER — OXYCODONE-ACETAMINOPHEN 5-325 MG PO TABS: 1.0000 | ORAL_TABLET | Freq: Four times a day (QID) | ORAL | Status: DC | PRN

## 2013-12-16 MED ORDER — SODIUM CHLORIDE 0.9 % IR SOLN
Status: DC | PRN
  Administered 2013-12-16: 6000 mL

## 2013-12-16 MED ORDER — DEXAMETHASONE SODIUM PHOSPHATE 4 MG/ML IJ SOLN
INTRAMUSCULAR | Status: DC | PRN
  Administered 2013-12-16: 10 mg via INTRAVENOUS

## 2013-12-16 MED ORDER — LIDOCAINE HCL (CARDIAC) 20 MG/ML IV SOLN
INTRAVENOUS | Status: DC | PRN
  Administered 2013-12-16: 60 mg via INTRAVENOUS

## 2013-12-16 MED ORDER — FENTANYL CITRATE 0.05 MG/ML IJ SOLN
INTRAMUSCULAR | Status: AC
  Filled 2013-12-16: qty 6

## 2013-12-16 MED ORDER — ONDANSETRON HCL 4 MG/2ML IJ SOLN
INTRAMUSCULAR | Status: DC | PRN
  Administered 2013-12-16: 4 mg via INTRAVENOUS

## 2013-12-16 MED ORDER — METHOCARBAMOL 500 MG PO TABS: 500.0000 mg | ORAL_TABLET | Freq: Four times a day (QID) | ORAL | Status: DC | PRN

## 2013-12-16 MED ORDER — ASPIRIN EC 325 MG PO TBEC: 325.0000 mg | DELAYED_RELEASE_TABLET | Freq: Two times a day (BID) | ORAL | Status: DC

## 2013-12-16 MED ORDER — POVIDONE-IODINE 7.5 % EX SOLN
Freq: Once | CUTANEOUS | Status: DC
  Filled 2013-12-16: qty 118

## 2013-12-16 MED ORDER — MIDAZOLAM HCL 5 MG/5ML IJ SOLN
INTRAMUSCULAR | Status: DC | PRN
Start: 2013-12-16 — End: 2013-12-16
  Administered 2013-12-16: 2 mg via INTRAVENOUS

## 2013-12-16 MED ORDER — LACTATED RINGERS IV SOLN
INTRAVENOUS | Status: DC
  Filled 2013-12-16: qty 1000

## 2013-12-16 MED ORDER — MORPHINE SULFATE 4 MG/ML IJ SOLN
INTRAMUSCULAR | Status: DC | PRN
  Administered 2013-12-16: 4 mg via INTRAMUSCULAR

## 2013-12-16 MED ORDER — MIDAZOLAM HCL 2 MG/2ML IJ SOLN
INTRAMUSCULAR | Status: AC
  Filled 2013-12-16: qty 2

## 2013-12-16 MED ORDER — FENTANYL CITRATE 0.05 MG/ML IJ SOLN
25.0000 ug | INTRAMUSCULAR | Status: DC | PRN
  Administered 2013-12-16: 25 ug via INTRAVENOUS
  Filled 2013-12-16: qty 1

## 2013-12-16 MED ORDER — OXYCODONE-ACETAMINOPHEN 5-325 MG PO TABS
ORAL_TABLET | ORAL | Status: AC
  Filled 2013-12-16: qty 1

## 2013-12-16 MED ORDER — CEPHALEXIN 500 MG PO CAPS: 500.0000 mg | ORAL_CAPSULE | Freq: Four times a day (QID) | ORAL | Status: DC

## 2013-12-16 MED ORDER — FENTANYL CITRATE 0.05 MG/ML IJ SOLN
INTRAMUSCULAR | Status: AC
  Filled 2013-12-16: qty 2

## 2013-12-16 MED ORDER — OXYCODONE-ACETAMINOPHEN 5-325 MG PO TABS
1.0000 | ORAL_TABLET | Freq: Four times a day (QID) | ORAL | Status: DC | PRN
  Administered 2013-12-16: 1 via ORAL
  Filled 2013-12-16: qty 2

## 2013-12-16 SURGICAL SUPPLY — 63 items
BANDAGE ESMARK 6X9 LF (GAUZE/BANDAGES/DRESSINGS) ×1 IMPLANT
BANDAGE GAUZE ELAST BULKY 4 IN (GAUZE/BANDAGES/DRESSINGS) ×1 IMPLANT
BLADE 4.2CUDA (BLADE) IMPLANT
BLADE CUDA GRT WHITE 3.5 (BLADE) ×2 IMPLANT
BLADE CUDA SHAVER 3.5 (BLADE) IMPLANT
BNDG CMPR 9X6 STRL LF SNTH (GAUZE/BANDAGES/DRESSINGS) ×1
BNDG ESMARK 6X9 LF (GAUZE/BANDAGES/DRESSINGS) ×2
BNDG GAUZE ELAST 4 BULKY (GAUZE/BANDAGES/DRESSINGS) ×4 IMPLANT
CANISTER SUCT LVC 12 LTR MEDI- (MISCELLANEOUS) ×3 IMPLANT
CANISTER SUCTION 1200CC (MISCELLANEOUS) ×2 IMPLANT
CLOTH BEACON ORANGE TIMEOUT ST (SAFETY) ×2 IMPLANT
COVER LIGHT HANDLE  1/PK (MISCELLANEOUS) ×1
COVER LIGHT HANDLE 1/PK (MISCELLANEOUS) IMPLANT
CUFF TOURNIQUET SINGLE 34IN LL (TOURNIQUET CUFF) ×2 IMPLANT
DRAIN PENROSE 18X1/4 LTX STRL (WOUND CARE) ×1 IMPLANT
DRAPE ARTHROSCOPY W/POUCH 114 (DRAPES) ×2 IMPLANT
DRAPE INCISE 23X17 IOBAN STRL (DRAPES) ×1
DRAPE INCISE 23X17 STRL (DRAPES) ×1 IMPLANT
DRAPE INCISE IOBAN 23X17 STRL (DRAPES) ×1 IMPLANT
DRAPE INCISE IOBAN 66X45 STRL (DRAPES) ×2 IMPLANT
DRAPE U 20/CS (DRAPES) ×1 IMPLANT
DURAPREP 26ML APPLICATOR (WOUND CARE) ×2 IMPLANT
ELECT MENISCUS 165MM 90D (ELECTRODE) IMPLANT
ELECT REM PT RETURN 9FT ADLT (ELECTROSURGICAL)
ELECTRODE REM PT RTRN 9FT ADLT (ELECTROSURGICAL) IMPLANT
GAUZE XEROFORM 1X8 LF (GAUZE/BANDAGES/DRESSINGS) ×2 IMPLANT
GLOVE BIO SURGEON STRL SZ7.5 (GLOVE) ×3 IMPLANT
GLOVE INDICATOR 7.5 STRL GRN (GLOVE) ×2 IMPLANT
GLOVE INDICATOR 8.0 STRL GRN (GLOVE) ×4 IMPLANT
GLOVE SURG ORTHO 8.0 STRL STRW (GLOVE) ×2 IMPLANT
GLOVE SURG SS PI 7.5 STRL IVOR (GLOVE) ×2 IMPLANT
GOWN PREVENTION PLUS LG XLONG (DISPOSABLE) ×1 IMPLANT
GOWN STRL REIN XL XLG (GOWN DISPOSABLE) ×1 IMPLANT
GOWN STRL REUS W/ TWL XL LVL3 (GOWN DISPOSABLE) IMPLANT
GOWN STRL REUS W/TWL XL LVL3 (GOWN DISPOSABLE) ×6
IMMOBILIZER KNEE 22 UNIV (SOFTGOODS) ×1 IMPLANT
IMMOBILIZER KNEE 24 THIGH 36 (MISCELLANEOUS) IMPLANT
IMMOBILIZER KNEE 24 UNIV (MISCELLANEOUS)
KNEE WRAP E Z 3 GEL PACK (MISCELLANEOUS) ×2 IMPLANT
MINI VAC (SURGICAL WAND) ×2 IMPLANT
NEEDLE HYPO 22GX1.5 SAFETY (NEEDLE) ×2 IMPLANT
PACK ARTHROSCOPY DSU (CUSTOM PROCEDURE TRAY) ×2 IMPLANT
PACK BASIN DAY SURGERY FS (CUSTOM PROCEDURE TRAY) ×2 IMPLANT
PAD ABD 8X10 STRL (GAUZE/BANDAGES/DRESSINGS) ×4 IMPLANT
PADDING CAST ABS 4INX4YD NS (CAST SUPPLIES) ×1
PADDING CAST ABS COTTON 4X4 ST (CAST SUPPLIES) ×1 IMPLANT
PENCIL BUTTON HOLSTER BLD 10FT (ELECTRODE) IMPLANT
SET ARTHROSCOPY TUBING (MISCELLANEOUS) ×2
SET ARTHROSCOPY TUBING LN (MISCELLANEOUS) ×1 IMPLANT
SHEET MEDIUM DRAPE 40X70 STRL (DRAPES) ×1 IMPLANT
SLEEVE SURGEON STRL (DRAPES) ×1 IMPLANT
SPONGE GAUZE 4X4 12PLY (GAUZE/BANDAGES/DRESSINGS) ×2 IMPLANT
SPONGE GAUZE 4X4 12PLY STER LF (GAUZE/BANDAGES/DRESSINGS) ×1 IMPLANT
SUT ETHILON 4 0 PS 2 18 (SUTURE) ×2 IMPLANT
SUT VIC AB 2-0 CT2 27 (SUTURE) ×1 IMPLANT
SWAB CULTURE LIQ STUART DBL (MISCELLANEOUS) ×1 IMPLANT
SYR BULB IRRIGATION 50ML (SYRINGE) ×1 IMPLANT
SYR CONTROL 10ML LL (SYRINGE) ×2 IMPLANT
TOWEL OR 17X24 6PK STRL BLUE (TOWEL DISPOSABLE) ×2 IMPLANT
TUBE ANAEROBIC SPECIMEN COL (MISCELLANEOUS) ×1 IMPLANT
WAND 30 DEG SABER W/CORD (SURGICAL WAND) IMPLANT
WAND 90 DEG TURBOVAC W/CORD (SURGICAL WAND) IMPLANT
WATER STERILE IRR 500ML POUR (IV SOLUTION) ×2 IMPLANT

## 2013-12-16 NOTE — H&P (View-Only) (Signed)
NPO AFTER MN WITH EXCEPTION CLEAR LIQUIDS UNTIL 0715 (NO CREAM/ MILK PRODUCTS).  ARRIVE AT 1115. NEEDS ISTAT, URINE PREG., AND EKG. WILL TAKE PEPCID, SECTRAL AND DO FLONASE AM DOS W/ SIPS OF WATER. 

## 2013-12-16 NOTE — H&P (Signed)
Carol Hamilton is an 48 y.o. female.   Chief Complaint: Right knee cyst and knee pain HPI: Patient presents with joint discomfort that had been persistent for several months now. Despite conservative treatments, her discomfort has not improved. Imaging was obtained. Other conservative and surgical treatments were discussed in detail. Patient wishes to proceed with surgery as consented. Denies SOB, CP, or calf pain. No Fever, chills, or nausea/ vomiting.   Past Medical History  Diagnosis Date  . Hypertension   . Right knee meniscal tear   . GERD (gastroesophageal reflux disease)   . PVC's (premature ventricular contractions)   . History of endometriosis     left ovary--  s/p  lso  2012  . Headache(784.0)     Migraines  . OA (osteoarthritis) of knee     RIGHT  . RLS (restless legs syndrome)   . Wears glasses     Past Surgical History  Procedure Laterality Date  . Shoulder arthroscopy w/ labral repair Right 2004    [name]  . Foot surgery  child    FBO-removed needle left foot  . Robot assisted left salpingoophorectomy /  right salpingectomy  06-11-2011  . Laparoscopic cholecystectomy  2008     [facility]  . Knee arthroscopy with anterior cruciate ligament (acl) repair Right 2013    Family History  Problem Relation Age of Onset  . Anesthesia problems Sister    Social History:  reports that she has never smoked. She has never used smokeless tobacco. She reports that she drinks about .5 ounces of alcohol per week. She reports that she does not use illicit drugs.  Allergies:  Allergies  Allergen Reactions  . Adhesive [Tape] Other (See Comments)    Blisters/    ALSO EKG LEAD ADHESIVE CAUSES BLISTERING  . Amoxicillin Nausea And Vomiting    Severe  n/v  . Azithromycin Swelling  . Erythromycin Swelling  . Sulfa Antibiotics Rash    Medications Prior to Admission  Medication Sig Dispense Refill  . acebutolol (SECTRAL) 200 MG capsule Take 200 mg by mouth  every morning.       . diclofenac sodium (VOLTAREN) 1 % GEL Apply topically 4 (four) times daily as needed.      . famotidine (PEPCID) 20 MG tablet Take 20 mg by mouth as needed for heartburn or indigestion.      . fluticasone (FLONASE) 50 MCG/ACT nasal spray Place 1 spray into both nostrils every morning.      . gabapentin (NEURONTIN) 300 MG capsule Take 300 mg by mouth at bedtime.      Marland Kitchen lisinopril (PRINIVIL,ZESTRIL) 10 MG tablet Take 10 mg by mouth every morning.      Marland Kitchen acetaminophen (TYLENOL) 500 MG tablet Take 500 mg by mouth every 6 (six) hours as needed.      . rizatriptan (MAXALT-MLT) 10 MG disintegrating tablet Take 10 mg by mouth as needed. May repeat in 2 hours if needed..headache        Results for orders placed during the hospital encounter of 12/16/13 (from the past 48 hour(s))  POCT PREGNANCY, URINE     Status: None   Collection Time    12/16/13 11:57 AM      Result Value Ref Range   Preg Test, Ur NEGATIVE  NEGATIVE   Comment:            THE SENSITIVITY OF THIS     METHODOLOGY IS >24 mIU/mL  POCT I-STAT 4, (NA,K, GLUC, HGB,HCT)  Status: None   Collection Time    12/16/13 12:17 PM      Result Value Ref Range   Sodium 141  137 - 147 mEq/L   Potassium 4.3  3.7 - 5.3 mEq/L   Glucose, Bld 94  70 - 99 mg/dL   HCT 38.0  36.0 - 46.0 %   Hemoglobin 12.9  12.0 - 15.0 g/dL   No results found.  Review of Systems  Constitutional: Negative.   HENT: Negative.   Eyes: Negative.   Respiratory: Negative.   Cardiovascular: Negative.   Gastrointestinal: Negative.   Genitourinary: Negative.   Musculoskeletal: Positive for joint pain.  Skin: Negative.   Neurological: Negative.   Endo/Heme/Allergies: Negative.   Psychiatric/Behavioral: Negative.     Blood pressure 129/87, pulse 81, temperature 97.9 F (36.6 C), temperature source Oral, resp. rate 16, height 5\' 10"  (1.778 m), weight 89.812 kg (198 lb), last menstrual period 12/07/2013, SpO2 99.00%. Physical Exam   Constitutional: She is oriented to person, place, and time. She appears well-developed.  HENT:  Head: Normocephalic.  Eyes: EOM are normal.  Neck: Normal range of motion.  Cardiovascular: Normal rate, normal heart sounds and intact distal pulses.   Respiratory: Effort normal and breath sounds normal.  GI: Soft. Bowel sounds are normal.  Genitourinary:  Deferred  Musculoskeletal: She exhibits edema and tenderness.  Anterior tibia cyst  Neurological: She is alert and oriented to person, place, and time.  Skin: Skin is warm and dry.  Psychiatric: Her behavior is normal.     Assessment/Plan Right knee meniscus tear and cyst: Arthroscopy with open cyst excision D/C home Today Follow up in the office Follow instructions   Carol Hamilton 12/16/2013, 1:42 PM

## 2013-12-16 NOTE — Discharge Instructions (Signed)

## 2013-12-16 NOTE — Anesthesia Preprocedure Evaluation (Addendum)
Anesthesia Evaluation  Patient identified by MRN, date of birth, ID band Patient awake    Reviewed: Allergy & Precautions, H&P , NPO status , Patient's Chart, lab work & pertinent test results  Airway Mallampati: II TM Distance: >3 FB Neck ROM: full    Dental no notable dental hx.    Pulmonary neg pulmonary ROS,  breath sounds clear to auscultation  Pulmonary exam normal       Cardiovascular Exercise Tolerance: Good hypertension, Pt. on medications Rhythm:regular Rate:Normal  PVC's   Neuro/Psych negative neurological ROS  negative psych ROS   GI/Hepatic negative GI ROS, Neg liver ROS, GERD-  Medicated and Controlled,  Endo/Other  negative endocrine ROS  Renal/GU negative Renal ROS  negative genitourinary   Musculoskeletal   Abdominal   Peds  Hematology negative hematology ROS (+)   Anesthesia Other Findings   Reproductive/Obstetrics negative OB ROS                         Anesthesia Physical Anesthesia Plan  ASA: II  Anesthesia Plan: General   Post-op Pain Management:    Induction:   Airway Management Planned: LMA  Additional Equipment:   Intra-op Plan:   Post-operative Plan:   Informed Consent: I have reviewed the patients History and Physical, chart, labs and discussed the procedure including the risks, benefits and alternatives for the proposed anesthesia with the patient or authorized representative who has indicated his/her understanding and acceptance.   Dental Advisory Given  Plan Discussed with: CRNA and Surgeon  Anesthesia Plan Comments:        Anesthesia Quick Evaluation

## 2013-12-16 NOTE — Anesthesia Procedure Notes (Signed)
Procedure Name: LMA Insertion Date/Time: 12/16/2013 3:16 PM Performed by: Denna Haggard D Pre-anesthesia Checklist: Patient identified, Emergency Drugs available, Suction available and Patient being monitored Patient Re-evaluated:Patient Re-evaluated prior to inductionOxygen Delivery Method: Circle System Utilized Preoxygenation: Pre-oxygenation with 100% oxygen Intubation Type: IV induction Ventilation: Mask ventilation without difficulty LMA: LMA inserted LMA Size: 4.0 Number of attempts: 1 Airway Equipment and Method: bite block Placement Confirmation: positive ETCO2 Tube secured with: Tape Dental Injury: Teeth and Oropharynx as per pre-operative assessment

## 2013-12-16 NOTE — Transfer of Care (Signed)
Immediate Anesthesia Transfer of Care Note  Patient: Carol Hamilton  Procedure(s) Performed: Procedure(s) (LRB): I & D Mass with excisional biopsy right knee (Right)  Patient Location: PACU  Anesthesia Type: General  Level of Consciousness: awake, oriented, sedated and patient cooperative  Airway & Oxygen Therapy: Patient Spontanous Breathing and Patient connected to face mask oxygen  Post-op Assessment: Report given to PACU RN and Post -op Vital signs reviewed and stable  Post vital signs: Reviewed and stable  Complications: No apparent anesthesia complications

## 2013-12-16 NOTE — Interval H&P Note (Signed)
History and Physical Interval Note:  12/16/2013 2:52 PM  Carol Hamilton  has presented today for surgery, with the diagnosis of right knee torn medial meniscus/OA/Cyst  The various methods of treatment have been discussed with the patient and family. After consideration of risks, benefits and other options for treatment, the patient has consented to  Procedure(s): RIGHT KNEE ARTHROSCOPY WITH PARTIAL MEDIAL MENISECTOMY, CHONDROPLASTY AND OPEN CYST REMOVAL (Right) as a surgical intervention .  The patient's history has been reviewed, patient examined, no change in status, stable for surgery.  I have reviewed the patient's chart and labs.  Questions were answered to the patient's satisfaction.     Sydnee Cabal

## 2013-12-16 NOTE — Op Note (Signed)
Dictated. [id number]

## 2013-12-16 NOTE — Anesthesia Postprocedure Evaluation (Signed)
  Anesthesia Post-op Note  Patient: Carol Hamilton  Procedure(s) Performed: Procedure(s) (LRB): I & D Mass with excisional biopsy right knee (Right)  Patient Location: PACU  Anesthesia Type: General  Level of Consciousness: awake and alert   Airway and Oxygen Therapy: Patient Spontanous Breathing  Post-op Pain: mild  Post-op Assessment: Post-op Vital signs reviewed, Patient's Cardiovascular Status Stable, Respiratory Function Stable, Patent Airway and No signs of Nausea or vomiting  Last Vitals:  Filed Vitals:   12/16/13 1630  BP: 121/78  Pulse: 81  Temp:   Resp: 11    Post-op Vital Signs: stable   Complications: No apparent anesthesia complications

## 2013-12-17 ENCOUNTER — Encounter (HOSPITAL_BASED_OUTPATIENT_CLINIC_OR_DEPARTMENT_OTHER): Payer: Self-pay | Admitting: Specialist

## 2013-12-17 NOTE — Op Note (Addendum)
Carol Hamilton, TIEN NO.:  [id number]  MEDICAL RECORD NO.:  [id number]  LOCATION:                                 FACILITY:  PHYSICIAN:  Cynda Familia, M.D.DATE OF BIRTH:  1965/12/19  DATE OF PROCEDURE:  12/16/2013 DATE OF DISCHARGE:                              OPERATIVE REPORT   PREOPERATIVE DIAGNOSES: 1. Right knee torn medial meniscus. 2. Anteromedial tibial mass.  POSTOPERATIVE DIAGNOSES:  Right anteromedial tibial mass, foreign body reaction with sterile abscess versus infection.  PROCEDURES:  Right anteromedial tibial mass excisional biopsy. Irrigation and debridement.  SURGEON:  Cynda Familia, M.D.  ASSISTANT:  Wyatt Portela, PA-C.  ANESTHESIA:  General.  BLOOD LOSS:  Minimal.  TOURNIQUET TIME:  30 minutes at 250 mmHg.  DISPOSITION:  To PACU, stable.  OPERATIVE DETAILS:  The patient was counseled in the holding area. Correct site was identified, marked and signed appropriately.  TED hose was applied on uninvolved leg.  She was then taken to the operating room, placed in supine position under general anesthesia.  IV Ancef had been given.  Right lower extremity was elevated.  She was then prepped with DuraPrep and draped in the sterile fashion.  Time-out was done and confirmed by arm.  Exsanguinated with an Esmarch.  Tourniquet was inflated to 250 mmHg.  Knee had been examined and was found to be clinically stable.  At this point in time, it was opted to go ahead and proceed with the anteromedial tibial mass.  I made a skin incision to the skin and dermis of the old scar and immediately I opened was thick yellow viscous material, which appeared purulent in nature.  Gram stain was obtained and culture, aerobic and anaerobic.  Entire area was debrided down, then it was also look like a calcific foreign body response over the tibial tunnel opening. Size was 2cm x3cm. All this abnormal tissue was excised with the pathologic  review.  The wound down the tunnel where the remaining screw was degraded and thin and relatively milky.  It was debrided with rongeur and curettes, removing all the retained screw. The graft was intact and knee was stable.  All suprapatellar tendon grafts were protected and the patellar tendon throughout the entire case.  At this time, after meticulous debridement, copious irrigation was performed with 6 liters of saline.  Hemostasis was obtained meticulously with cautery.  The subcu was closed with Vicryl, the skin was closed with nylon over a Penrose drain.  Sterile dressing was applied.  At this point in time, I did not feel comfortable on arthroscope in her knee with possibly introducing __________, although I think it is less likely that this was infection and it was totally different from diagnosis, and I did not feel comfortable to proceed with knee arthroscopy.  We will postpone and come back from, this has been more clearly evaluated.  She was then awakened, taken from the operating room to PACU in stable condition, and TED hose, bulky dressing and knee immobilizer.  To help the patient with positioning, prepping, draping, and technical assistance, Mr. Wyatt Portela, PA-C's, assistance was needed throughout the entire case and also wound closure and dressing.  ______________________________ Cynda Familia, M.D.     RAC/MEDQ  D:  12/16/2013  T:  12/17/2013  Job:  [id number]

## 2013-12-19 LAB — CULTURE, ROUTINE-ABSCESS: Culture: NO GROWTH

## 2013-12-21 LAB — ANAEROBIC CULTURE

## 2014-01-31 ENCOUNTER — Other Ambulatory Visit: Payer: Self-pay | Admitting: Orthopedic Surgery

## 2014-01-31 ENCOUNTER — Encounter (HOSPITAL_BASED_OUTPATIENT_CLINIC_OR_DEPARTMENT_OTHER): Payer: Self-pay | Admitting: *Deleted

## 2014-01-31 NOTE — Progress Notes (Addendum)
NPO AFTER MN WITH EXCEPTION CLEAR LIQUIDS (NO CREAM/ MILK PRODUCTS) UNTIL 0830.  ARRIVE AT 1230. NEEDS ISTAT AND URINE PREG. CURRENT EKG IN CHART AND EPIC. WILL TAKE SECTRAL , PEPCID, AND DO FLONASE NASAL SPRAY AM DOS W/ SIPS OF WATER.

## 2014-02-03 ENCOUNTER — Encounter (HOSPITAL_BASED_OUTPATIENT_CLINIC_OR_DEPARTMENT_OTHER)
Admission: RE | Disposition: A | Discharge status: Home / Self Care | Payer: Self-pay | Source: Ambulatory Visit | Attending: Specialist

## 2014-02-03 ENCOUNTER — Ambulatory Visit (HOSPITAL_BASED_OUTPATIENT_CLINIC_OR_DEPARTMENT_OTHER)
Admission: RE | Admit: 2014-02-03 | Discharge: 2014-02-03 | Disposition: A | Discharge status: Home / Self Care | Payer: 59 | Source: Ambulatory Visit | Attending: Specialist | Admitting: Specialist

## 2014-02-03 ENCOUNTER — Ambulatory Visit (HOSPITAL_BASED_OUTPATIENT_CLINIC_OR_DEPARTMENT_OTHER): Payer: 59 | Admitting: Anesthesiology

## 2014-02-03 ENCOUNTER — Encounter (HOSPITAL_BASED_OUTPATIENT_CLINIC_OR_DEPARTMENT_OTHER): Payer: Self-pay | Admitting: *Deleted

## 2014-02-03 ENCOUNTER — Encounter (HOSPITAL_BASED_OUTPATIENT_CLINIC_OR_DEPARTMENT_OTHER): Payer: 59 | Admitting: Anesthesiology

## 2014-02-03 DIAGNOSIS — I10 Essential (primary) hypertension: Secondary | ICD-10-CM | POA: Insufficient documentation

## 2014-02-03 DIAGNOSIS — Z79899 Other long term (current) drug therapy: Secondary | ICD-10-CM | POA: Insufficient documentation

## 2014-02-03 DIAGNOSIS — Z9889 Other specified postprocedural states: Secondary | ICD-10-CM

## 2014-02-03 DIAGNOSIS — M7500 Adhesive capsulitis of unspecified shoulder: Secondary | ICD-10-CM | POA: Insufficient documentation

## 2014-02-03 DIAGNOSIS — M1710 Unilateral primary osteoarthritis, unspecified knee: Secondary | ICD-10-CM | POA: Insufficient documentation

## 2014-02-03 DIAGNOSIS — G43909 Migraine, unspecified, not intractable, without status migrainosus: Secondary | ICD-10-CM | POA: Insufficient documentation

## 2014-02-03 DIAGNOSIS — M19019 Primary osteoarthritis, unspecified shoulder: Secondary | ICD-10-CM | POA: Insufficient documentation

## 2014-02-03 DIAGNOSIS — M7580 Other shoulder lesions, unspecified shoulder: Secondary | ICD-10-CM

## 2014-02-03 DIAGNOSIS — M25819 Other specified joint disorders, unspecified shoulder: Secondary | ICD-10-CM | POA: Insufficient documentation

## 2014-02-03 DIAGNOSIS — K219 Gastro-esophageal reflux disease without esophagitis: Secondary | ICD-10-CM | POA: Insufficient documentation

## 2014-02-03 DIAGNOSIS — M24119 Other articular cartilage disorders, unspecified shoulder: Secondary | ICD-10-CM | POA: Insufficient documentation

## 2014-02-03 DIAGNOSIS — Z882 Allergy status to sulfonamides status: Secondary | ICD-10-CM | POA: Insufficient documentation

## 2014-02-03 HISTORY — PX: SHOULDER ARTHROSCOPY WITH SUBACROMIAL DECOMPRESSION, ROTATOR CUFF REPAIR AND BICEP TENDON REPAIR: SHX5687

## 2014-02-03 HISTORY — DX: Superior glenoid labrum lesion of unspecified shoulder, initial encounter: S43.439A

## 2014-02-03 HISTORY — DX: Adhesive capsulitis of left shoulder: M75.02

## 2014-02-03 LAB — POCT I-STAT 4, (NA,K, GLUC, HGB,HCT)
Glucose, Bld: 89 mg/dL (ref 70–99)
HCT: 37 % (ref 36.0–46.0)
Hemoglobin: 12.6 g/dL (ref 12.0–15.0)
Potassium: 3.9 mEq/L (ref 3.7–5.3)
SODIUM: 141 meq/L (ref 137–147)

## 2014-02-03 LAB — POCT PREGNANCY, URINE: Preg Test, Ur: NEGATIVE

## 2014-02-03 SURGERY — SHOULDER ARTHROSCOPY WITH SUBACROMIAL DECOMPRESSION, ROTATOR CUFF REPAIR AND BICEP TENDON REPAIR
Anesthesia: General | Site: Shoulder | Laterality: Left

## 2014-02-03 MED ORDER — ONDANSETRON HCL 4 MG/2ML IJ SOLN
INTRAMUSCULAR | Status: DC | PRN
  Administered 2014-02-03: 4 mg via INTRAVENOUS

## 2014-02-03 MED ORDER — PROMETHAZINE HCL 25 MG/ML IJ SOLN
6.2500 mg | INTRAMUSCULAR | Status: DC | PRN
  Filled 2014-02-03: qty 1

## 2014-02-03 MED ORDER — ROPIVACAINE HCL 5 MG/ML IJ SOLN
INTRAMUSCULAR | Status: DC | PRN
  Administered 2014-02-03: 20 mL via PERINEURAL

## 2014-02-03 MED ORDER — FENTANYL CITRATE 0.05 MG/ML IJ SOLN
INTRAMUSCULAR | Status: DC | PRN
  Administered 2014-02-03: 100 ug via INTRAVENOUS

## 2014-02-03 MED ORDER — CEFAZOLIN SODIUM-DEXTROSE 2-3 GM-% IV SOLR
2.0000 g | INTRAVENOUS | Status: AC
  Administered 2014-02-03: 2 g via INTRAVENOUS
  Filled 2014-02-03: qty 50

## 2014-02-03 MED ORDER — METHOCARBAMOL 500 MG PO TABS: 500.0000 mg | ORAL_TABLET | Freq: Four times a day (QID) | ORAL | Status: DC | PRN

## 2014-02-03 MED ORDER — POVIDONE-IODINE 7.5 % EX SOLN
Freq: Once | CUTANEOUS | Status: DC
Start: 2014-02-03 — End: 2014-02-03
  Filled 2014-02-03: qty 118

## 2014-02-03 MED ORDER — FENTANYL CITRATE 0.05 MG/ML IJ SOLN
INTRAMUSCULAR | Status: AC
  Filled 2014-02-03: qty 2

## 2014-02-03 MED ORDER — LACTATED RINGERS IV SOLN
INTRAVENOUS | Status: DC
  Administered 2014-02-03: 12:00:00 via INTRAVENOUS
  Filled 2014-02-03: qty 1000

## 2014-02-03 MED ORDER — FENTANYL CITRATE 0.05 MG/ML IJ SOLN
100.0000 ug | INTRAMUSCULAR | Status: DC | PRN
  Administered 2014-02-03 (×2): 50 ug via INTRAVENOUS
  Filled 2014-02-03: qty 2

## 2014-02-03 MED ORDER — BUPIVACAINE HCL 0.25 % IJ SOLN
INTRAMUSCULAR | Status: DC | PRN
  Administered 2014-02-03: 20 mL

## 2014-02-03 MED ORDER — MIDAZOLAM HCL 2 MG/2ML IJ SOLN
1.0000 mg | INTRAMUSCULAR | Status: DC | PRN
  Administered 2014-02-03 (×2): 1 mg via INTRAVENOUS
  Filled 2014-02-03: qty 1

## 2014-02-03 MED ORDER — MIDAZOLAM HCL 2 MG/2ML IJ SOLN
INTRAMUSCULAR | Status: AC
  Filled 2014-02-03: qty 2

## 2014-02-03 MED ORDER — CEPHALEXIN 500 MG PO CAPS: 500.0000 mg | ORAL_CAPSULE | Freq: Three times a day (TID) | ORAL | Status: DC

## 2014-02-03 MED ORDER — PROPOFOL 10 MG/ML IV BOLUS
INTRAVENOUS | Status: DC | PRN
  Administered 2014-02-03: 50 mg via INTRAVENOUS
  Administered 2014-02-03: 150 mg via INTRAVENOUS

## 2014-02-03 MED ORDER — EPINEPHRINE HCL 1 MG/ML IJ SOLN
INTRAMUSCULAR | Status: DC | PRN
  Administered 2014-02-03: 2 mL via SUBCUTANEOUS

## 2014-02-03 MED ORDER — ACETAMINOPHEN 10 MG/ML IV SOLN
INTRAVENOUS | Status: DC | PRN
  Administered 2014-02-03: 1000 mg via INTRAVENOUS

## 2014-02-03 MED ORDER — SODIUM CHLORIDE 0.9 % IJ SOLN
INTRAMUSCULAR | Status: DC | PRN
  Administered 2014-02-03: 20 mL via INTRAVENOUS

## 2014-02-03 MED ORDER — TRIAMCINOLONE ACETONIDE 40 MG/ML IJ SUSP
INTRAMUSCULAR | Status: DC | PRN
  Administered 2014-02-03: 80 mg

## 2014-02-03 MED ORDER — LACTATED RINGERS IV SOLN
INTRAVENOUS | Status: DC
  Filled 2014-02-03: qty 1000

## 2014-02-03 MED ORDER — LIDOCAINE HCL (CARDIAC) 20 MG/ML IV SOLN
INTRAVENOUS | Status: DC | PRN
  Administered 2014-02-03: 100 mg via INTRAVENOUS

## 2014-02-03 MED ORDER — SODIUM CHLORIDE 0.9 % IR SOLN
Status: DC | PRN
  Administered 2014-02-03: 4000 mL

## 2014-02-03 MED ORDER — DEXAMETHASONE SODIUM PHOSPHATE 4 MG/ML IJ SOLN
INTRAMUSCULAR | Status: DC | PRN
  Administered 2014-02-03: 10 mg via INTRAVENOUS

## 2014-02-03 MED ORDER — GLYCOPYRROLATE 0.2 MG/ML IJ SOLN
INTRAMUSCULAR | Status: DC | PRN
  Administered 2014-02-03: 0.2 mg via INTRAVENOUS

## 2014-02-03 MED ORDER — SUCCINYLCHOLINE CHLORIDE 20 MG/ML IJ SOLN
INTRAMUSCULAR | Status: DC | PRN
  Administered 2014-02-03: 100 mg via INTRAVENOUS

## 2014-02-03 MED ORDER — OXYCODONE-ACETAMINOPHEN 5-325 MG PO TABS: 1.0000 | ORAL_TABLET | ORAL | Status: DC | PRN

## 2014-02-03 MED ORDER — HYDROMORPHONE HCL PF 1 MG/ML IJ SOLN
0.2500 mg | INTRAMUSCULAR | Status: DC | PRN
  Filled 2014-02-03: qty 1

## 2014-02-03 MED ORDER — ONDANSETRON 4 MG PO TBDP: 4.0000 mg | ORAL_TABLET | Freq: Three times a day (TID) | ORAL | Status: DC | PRN

## 2014-02-03 MED ORDER — CEFAZOLIN SODIUM-DEXTROSE 2-3 GM-% IV SOLR
INTRAVENOUS | Status: AC
  Filled 2014-02-03: qty 50

## 2014-02-03 SURGICAL SUPPLY — 82 items
BLADE CUDA 4.2 (BLADE) IMPLANT
BLADE CUDA GRT WHITE 3.5 (BLADE) ×2 IMPLANT
BLADE CUTTER GATOR 3.5 (BLADE) IMPLANT
BLADE GREAT WHITE 4.2 (BLADE) ×2 IMPLANT
BLADE SURG 11 STRL SS (BLADE) ×2 IMPLANT
BLADE SURG 15 STRL LF DISP TIS (BLADE) ×1 IMPLANT
BLADE SURG 15 STRL SS (BLADE) ×2
BUR 3.5 LG SPHERICAL (BURR) IMPLANT
BUR OVAL 6.0 (BURR) ×1 IMPLANT
BURR 3.5 LG SPHERICAL (BURR) ×2
CANISTER SUCT LVC 12 LTR MEDI- (MISCELLANEOUS) ×6 IMPLANT
CANISTER SUCTION 2500CC (MISCELLANEOUS) IMPLANT
CANNULA 5.75X7 CRYSTAL CLEAR (CANNULA) ×2 IMPLANT
CANNULA 5.75X71 LONG (CANNULA) IMPLANT
CANNULA TWIST IN 8.25X7CM (CANNULA) ×4 IMPLANT
CLOTH BEACON ORANGE TIMEOUT ST (SAFETY) ×3 IMPLANT
COVER MAYO STAND STRL (DRAPES) ×2 IMPLANT
COVER TABLE BACK 60X90 (DRAPES) ×2 IMPLANT
DRAPE LG THREE QUARTER DISP (DRAPES) ×2 IMPLANT
DRAPE ORTHO SPLIT 77X108 STRL (DRAPES) ×4
DRAPE POUCH INSTRU U-SHP 10X18 (DRAPES) ×2 IMPLANT
DRAPE STERI 35X30 U-POUCH (DRAPES) ×2 IMPLANT
DRAPE SURG 17X23 STRL (DRAPES) ×2 IMPLANT
DRAPE SURG ORHT 6 SPLT 77X108 (DRAPES) ×2 IMPLANT
DRAPE U-SHAPE 47X51 STRL (DRAPES) ×2 IMPLANT
DRSG PAD ABDOMINAL 8X10 ST (GAUZE/BANDAGES/DRESSINGS) ×1 IMPLANT
DURAPREP 26ML APPLICATOR (WOUND CARE) ×2 IMPLANT
ELECT MENISCUS 165MM 90D (ELECTRODE) IMPLANT
ELECT REM PT RETURN 9FT ADLT (ELECTROSURGICAL) ×2
ELECTRODE REM PT RTRN 9FT ADLT (ELECTROSURGICAL) ×1 IMPLANT
FIBERSTICK 2 (SUTURE) IMPLANT
GAUZE XEROFORM 1X8 LF (GAUZE/BANDAGES/DRESSINGS) ×2 IMPLANT
GLOVE BIO SURGEON STRL SZ7.5 (GLOVE) ×2 IMPLANT
GLOVE BIOGEL PI IND STRL 6.5 (GLOVE) IMPLANT
GLOVE BIOGEL PI INDICATOR 6.5 (GLOVE) ×1
GLOVE INDICATOR 7.0 STRL GRN (GLOVE) ×1 IMPLANT
GLOVE INDICATOR 8.0 STRL GRN (GLOVE) ×4 IMPLANT
GLOVE SURG ORTHO 8.0 STRL STRW (GLOVE) ×2 IMPLANT
GOWN PREVENTION PLUS LG XLONG (DISPOSABLE) ×1 IMPLANT
GOWN STRL REIN XL XLG (GOWN DISPOSABLE) ×2 IMPLANT
GOWN STRL REUS W/TWL LRG LVL3 (GOWN DISPOSABLE) ×1 IMPLANT
GOWN STRL REUS W/TWL XL LVL3 (GOWN DISPOSABLE) ×2 IMPLANT
IV NS IRRIG 3000ML ARTHROMATIC (IV SOLUTION) ×8 IMPLANT
KIT SHOULDER TRACTION (DRAPES) ×2 IMPLANT
LASSO SUT 90 DEGREE (SUTURE) IMPLANT
NDL 1/2 CIR CATGUT .05X1.09 (NEEDLE) IMPLANT
NDL SPNL 18GX3.5 QUINCKE PK (NEEDLE) ×1 IMPLANT
NEEDLE 1/2 CIR CATGUT .05X1.09 (NEEDLE) IMPLANT
NEEDLE HYPO 22GX1.5 SAFETY (NEEDLE) ×2 IMPLANT
NEEDLE SPNL 18GX3.5 QUINCKE PK (NEEDLE) ×2 IMPLANT
NS IRRIG 500ML POUR BTL (IV SOLUTION) IMPLANT
PACK BASIN DAY SURGERY FS (CUSTOM PROCEDURE TRAY) ×2 IMPLANT
PAD ABD 8X10 STRL (GAUZE/BANDAGES/DRESSINGS) ×4 IMPLANT
PENCIL BUTTON HOLSTER BLD 10FT (ELECTRODE) IMPLANT
SET ARTHROSCOPY TUBING (MISCELLANEOUS) ×2
SET ARTHROSCOPY TUBING PVC (MISCELLANEOUS) ×1 IMPLANT
SLING ARM IMMOBILIZER MED (SOFTGOODS) ×1 IMPLANT
SLING ULTRA II AB L (ORTHOPEDIC SUPPLIES) IMPLANT
SLING ULTRA II AB S (ORTHOPEDIC SUPPLIES) IMPLANT
SPONGE GAUZE 4X4 12PLY (GAUZE/BANDAGES/DRESSINGS) ×2 IMPLANT
SPONGE GAUZE 4X4 12PLY STER LF (GAUZE/BANDAGES/DRESSINGS) ×1 IMPLANT
SPONGE LAP 4X18 X RAY DECT (DISPOSABLE) IMPLANT
SUCTION FRAZIER TIP 10 FR DISP (SUCTIONS) IMPLANT
SUT ETHILON 3 0 PS 1 (SUTURE) ×2 IMPLANT
SUT LASSO 45 DEGREE LEFT (SUTURE) IMPLANT
SUT LASSO 45D RIGHT (SUTURE) IMPLANT
SUT PDS AB 0 CT1 27 (SUTURE) IMPLANT
SUT VIC AB 0 CT1 36 (SUTURE) IMPLANT
SUT VIC AB 2-0 CT1 27 (SUTURE)
SUT VIC AB 2-0 CT1 TAPERPNT 27 (SUTURE) IMPLANT
SYR 20CC LL (SYRINGE) ×2 IMPLANT
SYR 3ML 18GX1 1/2 (SYRINGE) ×1 IMPLANT
SYR CONTROL 10ML LL (SYRINGE) IMPLANT
SYR TB 1ML 27GX1/2 SAFE (SYRINGE) ×1 IMPLANT
SYR TB 1ML 27GX1/2 SAFETY (SYRINGE) ×2
SYRINGE 12CC LL (MISCELLANEOUS) ×2 IMPLANT
TAPE HYPAFIX 6X30 (GAUZE/BANDAGES/DRESSINGS) ×1 IMPLANT
TOWEL OR 17X24 6PK STRL BLUE (TOWEL DISPOSABLE) ×4 IMPLANT
TUBE CONNECTING 12X1/4 (SUCTIONS) ×4 IMPLANT
WAND 90 DEG TURBOVAC W/CORD (SURGICAL WAND) ×2 IMPLANT
WATER STERILE IRR 500ML POUR (IV SOLUTION) ×2 IMPLANT
YANKAUER SUCT BULB TIP NO VENT (SUCTIONS) IMPLANT

## 2014-02-03 NOTE — Transfer of Care (Signed)
Immediate Anesthesia Transfer of Care Note  Patient: Carol Hamilton  Procedure(s) Performed: Procedure(s) with comments: LEFT SHOULDER ARTHROSCOPY WITH MANIPULATION UNDER ANESTHESIA/RELEASES SUBACROMIAL DECOMPRESSION/DISTAL CLAVICLE RESECTION/LABRAL DEBRIDEMENT. (Left) - ANESTHESIA:  GENERAL/INTERSCALENE BLOCK  Patient Location: PACU  Anesthesia Type:General  Level of Consciousness: sedated and responds to stimulation  Airway & Oxygen Therapy: Patient Spontanous Breathing and Patient connected to nasal cannula oxygen  Post-op Assessment: Report given to PACU RN  Post vital signs: Reviewed and stable  Complications: No apparent anesthesia complications

## 2014-02-03 NOTE — Interval H&P Note (Signed)
History and Physical Interval Note:  02/03/2014 1:21 PM  Carol Hamilton  has presented today for surgery, with the diagnosis of LEFT SHOULDER FLAP TEAR/ADHESIVE CAPSULITIS/ACOA  The various methods of treatment have been discussed with the patient and family. After consideration of risks, benefits and other options for treatment, the patient has consented to  Procedure(s) with comments: LEFT SHOULDER ARTHROSCOPY WITH MANIPULATION UNDER ANESTHESIA/RELEASES SUBACROMIAL DECOMPRESSION/DISTAL CLAVICAL RESECTION/LABRAL DEBRIDEMENT VS REPAIR/BICEPS TENOTOMY/POSSIBLE ROTATOR CUFF REPAIR (Left) - ANESTHESIA:  GENERAL/INTERSCALENE BLOCK as a surgical intervention .  The patient's history has been reviewed, patient examined, no change in status, stable for surgery.  I have reviewed the patient's chart and labs.  Questions were answered to the patient's satisfaction.     Carol Hamilton ANDREW

## 2014-02-03 NOTE — H&P (Signed)
Carol Hamilton is an 48 y.o. female.   Chief Complaint: Left shoulder discomfort   HPI: Patient presents with joint discomfort that had been persistent for several months now. Despite conservative treatments, her discomfort has not improved. Imaging was obtained. Other conservative and surgical treatments were discussed in detail. Patient wishes to proceed with surgery as consented. Denies SOB, CP, or calf pain. No Fever, chills, or nausea/ vomiting.   Past Medical History  Diagnosis Date  . Hypertension   . GERD (gastroesophageal reflux disease)   . PVC's (premature ventricular contractions)   . History of endometriosis     left ovary--  s/p  lso  2012  . Headache(784.0)     Migraines  . RLS (restless legs syndrome)   . Wears glasses   . OA (osteoarthritis) of knee     RIGHT  . Adhesive capsulitis of left shoulder   . Labral tear of shoulder     LEFT    Past Surgical History  Procedure Laterality Date  . Shoulder arthroscopy w/ labral repair Right 2004    [name]  . Foot surgery  child    FBO-removed needle left foot  . Robot assisted left salpingoophorectomy /  right salpingectomy  06-11-2011  . Laparoscopic cholecystectomy  2008     [facility]  . Knee arthroscopy with anterior cruciate ligament (acl) repair Right 2013  . Knee arthroscopy with medial menisectomy Right 12/16/2013    Procedure: I & D Mass with excisional biopsy right knee;  Surgeon: Sydnee Cabal, MD;  Location: [facility];  Service: Orthopedics;  Laterality: Right;    Family History  Problem Relation Age of Onset  . Anesthesia problems Sister    Social History:  reports that she has never smoked. She has never used smokeless tobacco. She reports that she drinks about .5 ounces of alcohol per week. She reports that she does not use illicit drugs.  Allergies:  Allergies  Allergen Reactions  . Adhesive [Tape] Other (See Comments)    Blisters/    ALSO EKG LEAD ADHESIVE  CAUSES BLISTERING  . Amoxicillin Nausea And Vomiting    Severe   . Azithromycin Swelling  . Erythromycin Swelling  . Sulfa Antibiotics Rash    Medications Prior to Admission  Medication Sig Dispense Refill  . acebutolol (SECTRAL) 200 MG capsule Take 200 mg by mouth every morning.       . diclofenac sodium (VOLTAREN) 1 % GEL Apply topically 4 (four) times daily as needed.      . famotidine (PEPCID) 20 MG tablet Take 20 mg by mouth as needed for heartburn or indigestion.      . fluticasone (FLONASE) 50 MCG/ACT nasal spray Place 1 spray into both nostrils every morning.      Marland Kitchen lisinopril (PRINIVIL,ZESTRIL) 10 MG tablet Take 10 mg by mouth every morning.      . methocarbamol (ROBAXIN) 500 MG tablet Take 1 tablet (500 mg total) by mouth every 6 (six) hours as needed for muscle spasms.  50 tablet  1  . rizatriptan (MAXALT-MLT) 10 MG disintegrating tablet Take 10 mg by mouth as needed. May repeat in 2 hours if needed..headache      . gabapentin (NEURONTIN) 300 MG capsule Take 300 mg by mouth at bedtime.        Results for orders placed during the hospital encounter of 02/03/14 (from the past 48 hour(s))  POCT PREGNANCY, URINE     Status: None   Collection Time  02/03/14 11:40 AM      Result Value Ref Range   Preg Test, Ur NEGATIVE  NEGATIVE   Comment:            THE SENSITIVITY OF THIS     METHODOLOGY IS >24 mIU/mL  POCT I-STAT 4, (NA,K, GLUC, HGB,HCT)     Status: None   Collection Time    02/03/14 11:43 AM      Result Value Ref Range   Sodium 141  137 - 147 mEq/L   Potassium 3.9  3.7 - 5.3 mEq/L   Glucose, Bld 89  70 - 99 mg/dL   HCT 37.0  36.0 - 46.0 %   Hemoglobin 12.6  12.0 - 15.0 g/dL   No results found.  Review of Systems  Constitutional: Negative.   HENT: Negative.   Eyes: Negative.   Respiratory: Negative.   Cardiovascular: Negative.   Gastrointestinal: Negative.   Genitourinary: Negative.   Musculoskeletal: Positive for joint pain.  Skin: Negative.    Neurological: Negative.   Endo/Heme/Allergies: Negative.   Psychiatric/Behavioral: Negative.     Blood pressure 96/64, pulse 84, temperature 97.4 F (36.3 C), temperature source Oral, resp. rate 11, height 5\' 10"  (1.778 m), weight 88.905 kg (196 lb), last menstrual period 01/31/2014, SpO2 97.00%. Physical Exam  Constitutional: She is oriented to person, place, and time. She appears well-developed.  HENT:  Head: Normocephalic.  Eyes: EOM are normal.  Neck: Normal range of motion.  Cardiovascular: Normal rate, regular rhythm, normal heart sounds and intact distal pulses.   Respiratory: Effort normal and breath sounds normal.  GI: Soft. Bowel sounds are normal.  Genitourinary:  Deferred  Musculoskeletal:  Left shoulder pain and Limited ROM.  Neurological: She is alert and oriented to person, place, and time.  Skin: Skin is warm and dry.  Psychiatric: Her behavior is normal.     Assessment/Plan Left shoulder SLAP tear, adhesive capsulitis, ACOA: Left EUA, MUA, Scope, SAD, DCR, biceps tenotomy, possible RCR, Labral repair vs debridement D/C home today Follow instructions Take medicines as directed F/u in office PT daily starting tomorrow for 2 weeks   [name] L 02/03/2014, 12:58 PM

## 2014-02-03 NOTE — H&P (View-Only) (Signed)
NPO AFTER MN WITH EXCEPTION CLEAR LIQUIDS (NO CREAM/ MILK PRODUCTS) UNTIL 0830.  ARRIVE AT 1230. NEEDS ISTAT AND URINE PREG. CURRENT EKG IN CHART AND EPIC. WILL TAKE SECTRAL , PEPCID, AND DO FLONASE NASAL SPRAY AM DOS W/ SIPS OF WATER.

## 2014-02-03 NOTE — Op Note (Signed)
[phone]

## 2014-02-03 NOTE — Discharge Instructions (Signed)
°  Post Anesthesia Home Care Instructions ° °Activity: °Get plenty of rest for the remainder of the day. A responsible adult should stay with you for 24 hours following the procedure.  °For the next 24 hours, DO NOT: °-Drive a car °-Operate machinery °-Drink alcoholic beverages °-Take any medication unless instructed by your physician °-Make any legal decisions or sign important papers. ° °Meals: °Start with liquid foods such as gelatin or soup. Progress to regular foods as tolerated. Avoid greasy, spicy, heavy foods. If nausea and/or vomiting occur, drink only clear liquids until the nausea and/or vomiting subsides. Call your physician if vomiting continues. ° °Special Instructions/Symptoms: °Your throat may feel dry or sore from the anesthesia or the breathing tube placed in your throat during surgery. If this causes discomfort, gargle with warm salt water. The discomfort should disappear within 24 hours. ° °Regional Anesthesia Blocks ° °1. Numbness or the inability to move the "blocked" extremity may last from 3-48 hours after placement. The length of time depends on the medication injected and your individual response to the medication. If the numbness is not going away after 48 hours, call your surgeon. ° °2. The extremity that is blocked will need to be protected until the numbness is gone and the  Strength has returned. Because you cannot feel it, you will need to take extra care to avoid injury. Because it may be weak, you may have difficulty moving it or using it. You may not know what position it is in without looking at it while the block is in effect. ° °3. For blocks in the legs and feet, returning to weight bearing and walking needs to be done carefully. You will need to wait until the numbness is entirely gone and the strength has returned. You should be able to move your leg and foot normally before you try and bear weight or walk. You will need someone to be with you when you first try to ensure you  do not fall and possibly risk injury. ° °4. Bruising and tenderness at the needle site are common side effects and will resolve in a few days. ° °5. Persistent numbness or new problems with movement should be communicated to the surgeon or the [facility] ([phone])/ [facility] ([phone]). °

## 2014-02-03 NOTE — Anesthesia Postprocedure Evaluation (Signed)
  Anesthesia Post-op Note  Patient: Carol Hamilton  Procedure(s) Performed: Procedure(s) (LRB): LEFT SHOULDER ARTHROSCOPY WITH MANIPULATION UNDER ANESTHESIA/RELEASES SUBACROMIAL DECOMPRESSION/DISTAL CLAVICLE RESECTION/LABRAL DEBRIDEMENT. (Left)  Patient Location: PACU  Anesthesia Type: General  Level of Consciousness: awake and alert   Airway and Oxygen Therapy: Patient Spontanous Breathing  Post-op Pain: mild  Post-op Assessment: Post-op Vital signs reviewed, Patient's Cardiovascular Status Stable, Respiratory Function Stable, Patent Airway and No signs of Nausea or vomiting  Last Vitals:  Filed Vitals:   02/03/14 1600  BP: 120/73  Pulse: 81  Temp:   Resp: 11    Post-op Vital Signs: stable   Complications: No apparent anesthesia complications

## 2014-02-03 NOTE — Anesthesia Procedure Notes (Addendum)
Anesthesia Regional Block:  Interscalene brachial plexus block  Pre-Anesthetic Checklist: ,, timeout performed, Correct Patient, Correct Site, Correct Laterality, Correct Procedure, Correct Position, site marked, Risks and benefits discussed,  Surgical consent,  Pre-op evaluation,  At surgeon's request and post-op pain management  Laterality: Left  Prep: chloraprep       Needles:  Injection technique: Single-shot  Needle Type: Echogenic Needle          Additional Needles:  Procedures: ultrasound guided (picture in chart) Interscalene brachial plexus block Narrative:  Start time: 02/03/2014 12:25 PM End time: 02/03/2014 12:35 PM Injection made incrementally with aspirations every 5 mL.  Performed by: Personally  Anesthesiologist: Freddie Apley MD   Procedure Name: Intubation Date/Time: 02/03/2014 1:34 PM Performed by: Bonney Aid Pre-anesthesia Checklist: Patient identified, Emergency Drugs available, Suction available and Patient being monitored Patient Re-evaluated:Patient Re-evaluated prior to inductionOxygen Delivery Method: Circle System Utilized Preoxygenation: Pre-oxygenation with 100% oxygen Intubation Type: IV induction Ventilation: Mask ventilation without difficulty Laryngoscope Size: Mac and 3 Grade View: Grade I Tube type: Oral Tube size: 7.0 mm Number of attempts: 1 Airway Equipment and Method: stylet and oral airway Placement Confirmation: ETT inserted through vocal cords under direct vision,  positive ETCO2 and breath sounds checked- equal and bilateral Secured at: 21 cm Tube secured with: Tape Dental Injury: Teeth and Oropharynx as per pre-operative assessment

## 2014-02-03 NOTE — Anesthesia Preprocedure Evaluation (Addendum)
Anesthesia Evaluation  Patient identified by MRN, date of birth, ID band Patient awake    Reviewed: Allergy & Precautions, H&P , NPO status , Patient's Chart, lab work & pertinent test results  Airway Mallampati: II TM Distance: >3 FB Neck ROM: full    Dental no notable dental hx. (+) Teeth Intact, Dental Advisory Given   Pulmonary neg pulmonary ROS,  breath sounds clear to auscultation  Pulmonary exam normal       Cardiovascular Exercise Tolerance: Good hypertension, Pt. on medications Rhythm:regular Rate:Normal  PVC's   Neuro/Psych negative neurological ROS  negative psych ROS   GI/Hepatic negative GI ROS, Neg liver ROS, GERD-  Medicated and Controlled,  Endo/Other  negative endocrine ROS  Renal/GU negative Renal ROS  negative genitourinary   Musculoskeletal   Abdominal   Peds  Hematology negative hematology ROS (+)   Anesthesia Other Findings   Reproductive/Obstetrics negative OB ROS                          Anesthesia Physical Anesthesia Plan  ASA: II  Anesthesia Plan: General   Post-op Pain Management:    Induction: Intravenous  Airway Management Planned: Oral ETT  Additional Equipment:   Intra-op Plan:   Post-operative Plan: Extubation in OR  Informed Consent: I have reviewed the patients History and Physical, chart, labs and discussed the procedure including the risks, benefits and alternatives for the proposed anesthesia with the patient or authorized representative who has indicated his/her understanding and acceptance.   Dental advisory given  Plan Discussed with: CRNA  Anesthesia Plan Comments:        Anesthesia Quick Evaluation

## 2014-02-03 NOTE — OR Nursing (Signed)
Patient said she is not allergy to tape.

## 2014-02-04 ENCOUNTER — Encounter (HOSPITAL_BASED_OUTPATIENT_CLINIC_OR_DEPARTMENT_OTHER): Payer: Self-pay | Admitting: Specialist

## 2014-02-04 NOTE — Op Note (Signed)
Carol Hamilton, Carol Hamilton NO.:  [id number]  MEDICAL RECORD NO.:  [id number]  LOCATION:                                 FACILITY:  PHYSICIAN:  Cynda Familia, M.D.DATE OF BIRTH:  July 10, 1966  DATE OF PROCEDURE:  02/03/2014 DATE OF DISCHARGE:                              OPERATIVE REPORT   PREOPERATIVE DIAGNOSES:  Left shoulder impingement syndrome, possible cuff tear, possible labral tear, adhesive capsulitis.  POSTOPERATIVE DIAGNOSES: 1. Left shoulder type 1 labral tear. 2. Impingement syndrome. 3. Acromioclavicular arthritis. 4. Adhesive capsulitis, frozen shoulder.  PROCEDURE: 1. Left shoulder manipulation under anesthesia. 2. Left shoulder glenohumeral arthroscopy with intraarticular releases     and thermal synovectomy.  Labral debridement. 3. Arthroscopic subacromial decompression with acromioplasty,     bursectomy, and CA ligament release. 4. Arthroscopic distal clavicle resection, Mumford procedure.  SURGEON:  Cynda Familia, M.D.  ASSISTANT:  Wyatt Portela, PA-C.  ANESTHESIA:  Interscalene block, general.  ESTIMATED BLOOD LOSS:  Less than 10 mL.  DRAINS:  None.  COMPLICATIONS:  None.  DISPOSITION:  PACU, stable.  OPERATIVE DETAILS:  The patient was counseled in the holding area. Correct site was identified.  IV was started.  Sedation was given. Block was administered.  Taken to the operating room, placed in supine position.  Antibiotics had been given.  PAS stockings placed for DVT prophylaxis.  Examination revealed range of motion __________ gentle manipulation under anesthesia.  Full range of motion was established.  Turned to right lateral decubitus position.  Appropriately padded and __________ prepped with DuraPrep and draped in sterile fashion.  Again, time-out had been done confirming the lateral side.  Overhead shoulder positioner was utilized, 30 degrees of abduction, 10 degrees of forward flexion, about 10 pound  longitudinal traction.  Posterior portal was created.  The arthroscope was placed to the glenohumeral joint.  The __________ bit of above from the release.  It was evacuated.  __________ had released __________ rotator cuff interval manually. Pressure was very carefully maintained removing excessive soft tissue extravasation. The anterior portal was made __________ technique through the rotator cuff interval.  She had a type 1 labral tear.  Superiorly, the __________ anchor was intact.  The __________ joint with excellent. Therefore, __________ rotator cuff appeared to be intact also.  The synovium is quite hyperemic at this time.  Glenohumeral ligament and __________ normal.  Subacromial region was inspected.  Very thick subacromial bursa.  Neurovascular structures were protected throughout the entire case. __________ established.  Subacromial bursectomy was performed.  There was some scuffing and fraying underneath the CA ligament, consistent with extrinsic impingement.  __________ periosteum CA ligament.  Bur was then placed posteriorly and the anterior, inferior, lateral acromioplasty was performed.  __________ subclavicular spurs with distal clavicle degenerative cyst.  __________ lateral 5-8 mm of clavicle was removed circumferentially.  Then the superior capsule was intact.  The clavicle was palpated, found to be stable __________ removed.  Hemostasis was obtained.  There was no other abnormalities. __________ was removed.  At this point in time, an 18-gauge spinal needle was placed in the shoulder joint.  __________Kenalog, 2 mL of 0.25 __________ postop pain and also for inflammation.  Port  was closed with sutures.  Sterile dressing applied.  Placed in a shoulder sling. Turned to supine, awakened, and taken from the operating room to PACU in in stable condition.  She will be stabilized in the PACU and discharged home.  To help with patient positioning, prepping, draping,  technical and surgical assistance throughout the entire case, wound closure, application of dressing and sling.  Mr. Wyatt Portela, PA-C's assistance was needed throughout the entire case.          ______________________________ Cynda Familia, M.D.     RAC/MEDQ  D:  02/03/2014  T:  02/04/2014  Job:  [id number]

## 2014-03-18 ENCOUNTER — Encounter (HOSPITAL_COMMUNITY): Payer: Self-pay | Admitting: Emergency Medicine

## 2014-03-18 ENCOUNTER — Emergency Department (HOSPITAL_COMMUNITY)
Admission: EM | Admit: 2014-03-18 | Discharge: 2014-03-18 | Disposition: A | Discharge status: Home / Self Care | Payer: 59 | Attending: Emergency Medicine | Admitting: Emergency Medicine

## 2014-03-18 ENCOUNTER — Emergency Department (HOSPITAL_COMMUNITY): Payer: 59

## 2014-03-18 DIAGNOSIS — Z792 Long term (current) use of antibiotics: Secondary | ICD-10-CM | POA: Insufficient documentation

## 2014-03-18 DIAGNOSIS — Z8719 Personal history of other diseases of the digestive system: Secondary | ICD-10-CM | POA: Diagnosis not present

## 2014-03-18 DIAGNOSIS — R1030 Lower abdominal pain, unspecified: Secondary | ICD-10-CM

## 2014-03-18 DIAGNOSIS — G43909 Migraine, unspecified, not intractable, without status migrainosus: Secondary | ICD-10-CM | POA: Insufficient documentation

## 2014-03-18 DIAGNOSIS — R112 Nausea with vomiting, unspecified: Secondary | ICD-10-CM | POA: Diagnosis not present

## 2014-03-18 DIAGNOSIS — M533 Sacrococcygeal disorders, not elsewhere classified: Secondary | ICD-10-CM | POA: Insufficient documentation

## 2014-03-18 DIAGNOSIS — Z880 Allergy status to penicillin: Secondary | ICD-10-CM | POA: Insufficient documentation

## 2014-03-18 DIAGNOSIS — Z8742 Personal history of other diseases of the female genital tract: Secondary | ICD-10-CM | POA: Insufficient documentation

## 2014-03-18 DIAGNOSIS — IMO0002 Reserved for concepts with insufficient information to code with codable children: Secondary | ICD-10-CM | POA: Insufficient documentation

## 2014-03-18 DIAGNOSIS — R1031 Right lower quadrant pain: Secondary | ICD-10-CM | POA: Insufficient documentation

## 2014-03-18 DIAGNOSIS — I10 Essential (primary) hypertension: Secondary | ICD-10-CM | POA: Diagnosis not present

## 2014-03-18 DIAGNOSIS — Z3202 Encounter for pregnancy test, result negative: Secondary | ICD-10-CM | POA: Diagnosis not present

## 2014-03-18 DIAGNOSIS — R300 Dysuria: Secondary | ICD-10-CM | POA: Insufficient documentation

## 2014-03-18 DIAGNOSIS — R1032 Left lower quadrant pain: Secondary | ICD-10-CM | POA: Diagnosis not present

## 2014-03-18 LAB — COMPREHENSIVE METABOLIC PANEL
ALK PHOS: 75 U/L (ref 39–117)
ALT: 22 U/L (ref 0–35)
AST: 25 U/L (ref 0–37)
Albumin: 3.9 g/dL (ref 3.5–5.2)
Anion gap: 15 (ref 5–15)
BUN: 6 mg/dL (ref 6–23)
CHLORIDE: 99 meq/L (ref 96–112)
CO2: 23 mEq/L (ref 19–32)
CREATININE: 0.74 mg/dL (ref 0.50–1.10)
Calcium: 9.4 mg/dL (ref 8.4–10.5)
GFR calc Af Amer: >90 mL/min (ref >90)
Glucose, Bld: 118 mg/dL — ABNORMAL HIGH (ref 70–99)
POTASSIUM: 3.9 meq/L (ref 3.7–5.3)
Sodium: 137 mEq/L (ref 137–147)
Total Bilirubin: 0.4 mg/dL (ref 0.3–1.2)
Total Protein: 7.4 g/dL (ref 6.0–8.3)

## 2014-03-18 LAB — URINALYSIS, ROUTINE W REFLEX MICROSCOPIC
BILIRUBIN URINE: NEGATIVE
GLUCOSE, UA: NEGATIVE mg/dL
HGB URINE DIPSTICK: NEGATIVE
Ketones, ur: NEGATIVE mg/dL
NITRITE: NEGATIVE
PH: 6.5 (ref 5.0–8.0)
Protein, ur: NEGATIVE mg/dL
Specific Gravity, Urine: 1.011 (ref 1.005–1.030)
Urobilinogen, UA: 0.2 mg/dL (ref 0.0–1.0)

## 2014-03-18 LAB — CBC WITH DIFFERENTIAL/PLATELET
Basophils Absolute: 0.1 10*3/uL (ref 0.0–0.1)
Basophils Relative: 1 % (ref 0–1)
Eosinophils Absolute: 0.1 10*3/uL (ref 0.0–0.7)
Eosinophils Relative: 2 % (ref 0–5)
HEMATOCRIT: 39.1 % (ref 36.0–46.0)
HEMOGLOBIN: 13.6 g/dL (ref 12.0–15.0)
Lymphocytes Relative: 16 % (ref 12–46)
Lymphs Abs: 1.1 10*3/uL (ref 0.7–4.0)
MCH: 31 pg (ref 26.0–34.0)
MCHC: 34.8 g/dL (ref 30.0–36.0)
MCV: 89.1 fL (ref 78.0–100.0)
MONO ABS: 0.5 10*3/uL (ref 0.1–1.0)
MONOS PCT: 8 % (ref 3–12)
NEUTROS ABS: 5.2 10*3/uL (ref 1.7–7.7)
Neutrophils Relative %: 73 % (ref 43–77)
Platelets: 282 10*3/uL (ref 150–400)
RBC: 4.39 MIL/uL (ref 3.87–5.11)
RDW: 12.7 % (ref 11.5–15.5)
WBC: 7 10*3/uL (ref 4.0–10.5)

## 2014-03-18 LAB — LIPASE, BLOOD: LIPASE: 26 U/L (ref 11–59)

## 2014-03-18 LAB — PREGNANCY, URINE: Preg Test, Ur: NEGATIVE

## 2014-03-18 LAB — URINE MICROSCOPIC-ADD ON: Urine-Other: NONE SEEN

## 2014-03-18 MED ORDER — SODIUM CHLORIDE 0.9 % IV BOLUS (SEPSIS)
1000.0000 mL | INTRAVENOUS | Status: AC
  Administered 2014-03-18: 1000 mL via INTRAVENOUS

## 2014-03-18 MED ORDER — ONDANSETRON HCL 4 MG/2ML IJ SOLN
4.0000 mg | Freq: Once | INTRAMUSCULAR | Status: AC
  Administered 2014-03-18: 4 mg via INTRAVENOUS
  Filled 2014-03-18: qty 2

## 2014-03-18 MED ORDER — IOHEXOL 300 MG/ML  SOLN
100.0000 mL | Freq: Once | INTRAMUSCULAR | Status: AC | PRN
  Administered 2014-03-18: 100 mL via INTRAVENOUS

## 2014-03-18 MED ORDER — IOHEXOL 300 MG/ML  SOLN
50.0000 mL | Freq: Once | INTRAMUSCULAR | Status: AC | PRN
  Administered 2014-03-18: 50 mL via ORAL

## 2014-03-18 NOTE — ED Provider Notes (Signed)
CSN: [id number]     Arrival date & time 03/18/14  0701 History   First MD Initiated Contact with Patient 03/18/14 [phone]     Chief Complaint  Patient presents with  . Nausea  . Abdominal Pain     (Consider location/radiation/quality/duration/timing/severity/associated sxs/prior Treatment) Patient is a 48 y.o. female presenting with abdominal pain. The history is provided by the patient.  Abdominal Pain Pain location:  LLQ and RLQ Pain quality: aching   Pain radiates to:  Does not radiate Pain severity:  Mild Onset quality:  Gradual Duration:  9 days Timing:  Constant Progression since onset: initially improving, but now worsening over last 2 days. Chronicity:  New Context comment:  Suspected diverticulitis Relieved by: cipro/flagyl seemed to help. Worsened by:  Nothing tried Ineffective treatments:  None tried Associated symptoms: dysuria (mild)   Associated symptoms: no chest pain, no cough, no diarrhea, no fatigue, no fever, no hematuria, no nausea, no shortness of breath and no vomiting     Past Medical History  Diagnosis Date  . Hypertension   . GERD (gastroesophageal reflux disease)   . PVC's (premature ventricular contractions)   . History of endometriosis     left ovary--  s/p  lso  2012  . Headache(784.0)     Migraines  . RLS (restless legs syndrome)   . Wears glasses   . OA (osteoarthritis) of knee     RIGHT  . Adhesive capsulitis of left shoulder   . Labral tear of shoulder     LEFT   Past Surgical History  Procedure Laterality Date  . Shoulder arthroscopy w/ labral repair Right 2004    [name]  . Foot surgery  child    FBO-removed needle left foot  . Robot assisted left salpingoophorectomy /  right salpingectomy  06-11-2011  . Laparoscopic cholecystectomy  2008     [facility]  . Knee arthroscopy with anterior cruciate ligament (acl) repair Right 2013  . Knee arthroscopy with medial menisectomy Right 12/16/2013    Procedure: I & D  Mass with excisional biopsy right knee;  Surgeon: Sydnee Cabal, MD;  Location: [place];  Service: Orthopedics;  Laterality: Right;  . Shoulder arthroscopy with subacromial decompression, rotator cuff repair and bicep tendon repair Left 02/03/2014    Procedure: LEFT SHOULDER ARTHROSCOPY WITH MANIPULATION UNDER ANESTHESIA/RELEASES SUBACROMIAL DECOMPRESSION/DISTAL CLAVICLE RESECTION/LABRAL DEBRIDEMENT.;  Surgeon: Sydnee Cabal, MD;  Location: [place];  Service: Orthopedics;  Laterality: Left;  ANESTHESIA:  GENERAL/INTERSCALENE BLOCK   Family History  Problem Relation Age of Onset  . Anesthesia problems Sister    History  Substance Use Topics  . Smoking status: Never Smoker   . Smokeless tobacco: Never Used  . Alcohol Use: 0.5 oz/week    1 drink(s) per week     Comment: occasionally    OB History   Grav Para Term Preterm Abortions TAB SAB Ect Mult Living                 Review of Systems  Constitutional: Negative for fever and fatigue.  HENT: Negative for congestion and drooling.   Eyes: Negative for pain.  Respiratory: Negative for cough and shortness of breath.   Cardiovascular: Negative for chest pain.  Gastrointestinal: Negative for nausea, vomiting, abdominal pain and diarrhea.  Genitourinary: Positive for dysuria (mild). Negative for hematuria.  Musculoskeletal: Negative for back pain, gait problem and neck pain.       Mild right lower buttock pain  Skin: Negative for  color change.  Neurological: Negative for dizziness and headaches.  Hematological: Negative for adenopathy.  Psychiatric/Behavioral: Negative for behavioral problems.  All other systems reviewed and are negative.     Allergies  Adhesive; Amoxicillin; Azithromycin; Erythromycin; and Sulfa antibiotics  Home Medications   Prior to Admission medications   Medication Sig Start Date End Date Taking? Authorizing Provider  acebutolol (SECTRAL) 200 MG capsule Take 200 mg by  mouth every morning.     Historical Provider, MD  cephALEXin (KEFLEX) 500 MG capsule Take 1 capsule (500 mg total) by mouth 3 (three) times daily. 02/03/14   Bryson L Stilwell, PA-C  diclofenac sodium (VOLTAREN) 1 % GEL Apply topically 4 (four) times daily as needed.    Historical Provider, MD  famotidine (PEPCID) 20 MG tablet Take 20 mg by mouth as needed for heartburn or indigestion.    Historical Provider, MD  fluticasone (FLONASE) 50 MCG/ACT nasal spray Place 1 spray into both nostrils every morning.    Historical Provider, MD  gabapentin (NEURONTIN) 300 MG capsule Take 300 mg by mouth at bedtime.    Historical Provider, MD  lisinopril (PRINIVIL,ZESTRIL) 10 MG tablet Take 10 mg by mouth every morning.    Historical Provider, MD  methocarbamol (ROBAXIN) 500 MG tablet Take 1 tablet (500 mg total) by mouth every 6 (six) hours as needed for muscle spasms. 02/03/14   Bryson L Stilwell, PA-C  ondansetron (ZOFRAN ODT) 4 MG disintegrating tablet Take 1 tablet (4 mg total) by mouth every 8 (eight) hours as needed for nausea or vomiting. 02/03/14   Bryson L Stilwell, PA-C  oxyCODONE-acetaminophen (ROXICET) 5-325 MG per tablet Take 1-2 tablets by mouth every 4 (four) hours as needed for severe pain. 02/03/14   Bryson L Stilwell, PA-C  rizatriptan (MAXALT-MLT) 10 MG disintegrating tablet Take 10 mg by mouth as needed. May repeat in 2 hours if needed..headache    Historical Provider, MD   BP 155/87  Pulse 113  Temp(Src) 97.9 F (36.6 C) (Oral)  Resp 24  SpO2 100%  LMP 01/07/2014 Physical Exam  Nursing note and vitals reviewed. Constitutional: She is oriented to person, place, and time. She appears well-developed and well-nourished.  HENT:  Head: Normocephalic.  Mouth/Throat: Oropharynx is clear and moist. No oropharyngeal exudate.  Eyes: Conjunctivae and EOM are normal. Pupils are equal, round, and reactive to light.  Neck: Normal range of motion. Neck supple.  Cardiovascular: Normal rate, regular rhythm,  normal heart sounds and intact distal pulses.  Exam reveals no gallop and no friction rub.   No murmur heard. Pulmonary/Chest: Effort normal and breath sounds normal. No respiratory distress. She has no wheezes.  Abdominal: Soft. Bowel sounds are normal. There is tenderness (mod ttp of LLQ, mild ttp of RLQ). There is no rebound and no guarding.  Musculoskeletal: Normal range of motion. She exhibits no edema and no tenderness.  Neurological: She is alert and oriented to person, place, and time.  Skin: Skin is warm and dry.  Psychiatric: She has a normal mood and affect. Her behavior is normal.    ED Course  Procedures (including critical care time) Labs Review Labs Reviewed  COMPREHENSIVE METABOLIC PANEL - Abnormal; Notable for the following:    Glucose, Bld 118 (*)    All other components within normal limits  URINALYSIS, ROUTINE W REFLEX MICROSCOPIC - Abnormal; Notable for the following:    Leukocytes, UA TRACE (*)    All other components within normal limits  CBC WITH DIFFERENTIAL  LIPASE, BLOOD  PREGNANCY, URINE  URINE MICROSCOPIC-ADD ON    Imaging Review Ct Abdomen Pelvis W Contrast  03/18/2014   CLINICAL DATA:  Lower abdominal pain, nausea, vomiting and fever.  EXAM: CT ABDOMEN AND PELVIS WITH CONTRAST  TECHNIQUE: Multidetector CT imaging of the abdomen and pelvis was performed using the standard protocol following bolus administration of intravenous contrast.  CONTRAST:  137mL OMNIPAQUE IOHEXOL 300 MG/ML  SOLN  COMPARISON:  08/20/2011  FINDINGS: The visualized lung bases, liver, pancreas, spleen, adrenal glands and kidneys are within normal limits. The gallbladder has been removed. There is no evidence of biliary ductal dilatation.  Bowel shows no evidence of obstruction or inflammation. No free fluid or abscess is identified. Diverticulosis of the descending and sigmoid colon present without evidence of diverticulitis. No masses, enlarged lymph nodes or hernias are identified.  In  the pelvis, well-circumscribed right adnexal cystic structure measures approximately 3.0 x 4.0 cm. This is likely a physiologic cyst. The uterus appears unremarkable. The bladder is normal. No abnormal calcifications. No vascular abnormalities. Bony structures are within normal limits.  IMPRESSION: No acute findings in the abdomen or pelvis. Colonic diverticulosis without diverticulitis. 3 x 4 cm right adnexal cyst.   Electronically Signed   By: Aletta Edouard M.D.   On: 03/18/2014 09:18     EKG Interpretation None      MDM   Final diagnoses:  Lower abdominal pain  Nausea and vomiting, vomiting of unspecified type    7:25 AM 48 y.o. female w hx of HTN who presents with left lower quadrant pain which began 9 days ago. She was seen 6 days ago at an urgent care and diagnosed clinically with diverticulitis. She was started on Cipro and Flagyl at that time and notes moderate improvement. She states she began feeling worse over the last 2 days. She notes that she had a temperature of 99.7 this morning. She has had some mild vomiting and loose stools but no blood in her emesis or stool. She is afebrile and vital signs are unremarkable here. Will get screening labs and imaging. 2/10 pain currently, she refuses pain medicine.   10:20 AM: I interpreted/reviewed the labs and/or imaging which were non-contributory.  Pt cont to appear well. Possibly viral syndrome. She has pain/nausea meds at home. I have discussed the diagnosis/risks/treatment options with the patient and believe the pt to be eligible for discharge home to follow-up with her pcp as needed. We also discussed returning to the ED immediately if new or worsening sx occur. We discussed the sx which are most concerning (e.g., fever, worsening pain, inability to tol po) that necessitate immediate return. Medications administered to the patient during their visit and any new prescriptions provided to the patient are listed below.  Medications given  during this visit Medications  sodium chloride 0.9 % bolus 1,000 mL (1,000 mLs Intravenous New Bag/Given 03/18/14 0757)  ondansetron (ZOFRAN) injection 4 mg (4 mg Intravenous Given 03/18/14 0757)  iohexol (OMNIPAQUE) 300 MG/ML solution 50 mL (50 mLs Oral Contrast Given 03/18/14 0745)  iohexol (OMNIPAQUE) 300 MG/ML solution 100 mL (100 mLs Intravenous Contrast Given 03/18/14 0904)    New Prescriptions   No medications on file     Pamella Pert, MD 03/18/14 1020

## 2014-03-18 NOTE — ED Notes (Signed)
Pt presents with c/o abdominal pain and nausea since 03/09/14. Pt was seen this past Saturday at [facility] walk-in and diagnosed with diverticulitis. Pt reports she woke up early this morning with nausea and a low-grade fever.

## 2014-03-18 NOTE — ED Notes (Signed)
MD at bedside. 

## 2015-12-04 DIAGNOSIS — M25473 Effusion, unspecified ankle: Secondary | ICD-10-CM | POA: Diagnosis not present

## 2015-12-04 DIAGNOSIS — L298 Other pruritus: Secondary | ICD-10-CM | POA: Diagnosis not present

## 2015-12-05 DIAGNOSIS — M722 Plantar fascial fibromatosis: Secondary | ICD-10-CM | POA: Diagnosis not present

## 2015-12-05 DIAGNOSIS — S93492D Sprain of other ligament of left ankle, subsequent encounter: Secondary | ICD-10-CM | POA: Diagnosis not present

## 2015-12-13 DIAGNOSIS — M25572 Pain in left ankle and joints of left foot: Secondary | ICD-10-CM | POA: Diagnosis not present

## 2015-12-16 DIAGNOSIS — M25572 Pain in left ankle and joints of left foot: Secondary | ICD-10-CM | POA: Diagnosis not present

## 2015-12-19 DIAGNOSIS — M25572 Pain in left ankle and joints of left foot: Secondary | ICD-10-CM | POA: Diagnosis not present

## 2015-12-22 DIAGNOSIS — M25572 Pain in left ankle and joints of left foot: Secondary | ICD-10-CM | POA: Diagnosis not present

## 2015-12-27 DIAGNOSIS — M25572 Pain in left ankle and joints of left foot: Secondary | ICD-10-CM | POA: Diagnosis not present

## 2015-12-29 DIAGNOSIS — M25572 Pain in left ankle and joints of left foot: Secondary | ICD-10-CM | POA: Diagnosis not present

## 2016-01-01 DIAGNOSIS — M25572 Pain in left ankle and joints of left foot: Secondary | ICD-10-CM | POA: Diagnosis not present

## 2016-01-03 DIAGNOSIS — M25572 Pain in left ankle and joints of left foot: Secondary | ICD-10-CM | POA: Diagnosis not present

## 2016-01-09 DIAGNOSIS — M25572 Pain in left ankle and joints of left foot: Secondary | ICD-10-CM | POA: Diagnosis not present

## 2016-01-11 DIAGNOSIS — M25572 Pain in left ankle and joints of left foot: Secondary | ICD-10-CM | POA: Diagnosis not present

## 2016-01-15 DIAGNOSIS — M25572 Pain in left ankle and joints of left foot: Secondary | ICD-10-CM | POA: Diagnosis not present

## 2016-01-17 DIAGNOSIS — M25572 Pain in left ankle and joints of left foot: Secondary | ICD-10-CM | POA: Diagnosis not present

## 2016-02-03 DIAGNOSIS — M25572 Pain in left ankle and joints of left foot: Secondary | ICD-10-CM | POA: Diagnosis not present

## 2016-02-05 DIAGNOSIS — M25572 Pain in left ankle and joints of left foot: Secondary | ICD-10-CM | POA: Diagnosis not present

## 2016-02-08 DIAGNOSIS — M25572 Pain in left ankle and joints of left foot: Secondary | ICD-10-CM | POA: Diagnosis not present

## 2016-02-12 DIAGNOSIS — M25572 Pain in left ankle and joints of left foot: Secondary | ICD-10-CM | POA: Diagnosis not present

## 2016-02-17 DIAGNOSIS — M25572 Pain in left ankle and joints of left foot: Secondary | ICD-10-CM | POA: Diagnosis not present

## 2016-02-19 DIAGNOSIS — M25572 Pain in left ankle and joints of left foot: Secondary | ICD-10-CM | POA: Diagnosis not present

## 2016-02-21 DIAGNOSIS — M25572 Pain in left ankle and joints of left foot: Secondary | ICD-10-CM | POA: Diagnosis not present

## 2016-02-23 DIAGNOSIS — M25572 Pain in left ankle and joints of left foot: Secondary | ICD-10-CM | POA: Diagnosis not present

## 2016-02-26 DIAGNOSIS — M25572 Pain in left ankle and joints of left foot: Secondary | ICD-10-CM | POA: Diagnosis not present

## 2016-02-28 DIAGNOSIS — M25572 Pain in left ankle and joints of left foot: Secondary | ICD-10-CM | POA: Diagnosis not present

## 2016-03-01 DIAGNOSIS — M25572 Pain in left ankle and joints of left foot: Secondary | ICD-10-CM | POA: Diagnosis not present

## 2016-03-04 DIAGNOSIS — M722 Plantar fascial fibromatosis: Secondary | ICD-10-CM | POA: Diagnosis not present

## 2016-03-04 DIAGNOSIS — M25572 Pain in left ankle and joints of left foot: Secondary | ICD-10-CM | POA: Diagnosis not present

## 2016-03-04 DIAGNOSIS — M2142 Flat foot [pes planus] (acquired), left foot: Secondary | ICD-10-CM | POA: Diagnosis not present

## 2016-03-22 DIAGNOSIS — Z1231 Encounter for screening mammogram for malignant neoplasm of breast: Secondary | ICD-10-CM | POA: Diagnosis not present

## 2016-04-02 DIAGNOSIS — J039 Acute tonsillitis, unspecified: Secondary | ICD-10-CM | POA: Diagnosis not present

## 2016-04-23 DIAGNOSIS — D225 Melanocytic nevi of trunk: Secondary | ICD-10-CM | POA: Diagnosis not present

## 2016-04-23 DIAGNOSIS — L821 Other seborrheic keratosis: Secondary | ICD-10-CM | POA: Diagnosis not present

## 2016-04-23 DIAGNOSIS — L820 Inflamed seborrheic keratosis: Secondary | ICD-10-CM | POA: Diagnosis not present

## 2016-04-23 DIAGNOSIS — D1801 Hemangioma of skin and subcutaneous tissue: Secondary | ICD-10-CM | POA: Diagnosis not present

## 2016-04-23 DIAGNOSIS — L814 Other melanin hyperpigmentation: Secondary | ICD-10-CM | POA: Diagnosis not present

## 2016-05-06 DIAGNOSIS — R1032 Left lower quadrant pain: Secondary | ICD-10-CM | POA: Diagnosis not present

## 2016-05-06 DIAGNOSIS — Z23 Encounter for immunization: Secondary | ICD-10-CM | POA: Diagnosis not present

## 2016-07-26 DIAGNOSIS — S93492D Sprain of other ligament of left ankle, subsequent encounter: Secondary | ICD-10-CM | POA: Diagnosis not present

## 2016-07-26 DIAGNOSIS — R2242 Localized swelling, mass and lump, left lower limb: Secondary | ICD-10-CM | POA: Diagnosis not present

## 2016-08-03 DIAGNOSIS — S93402D Sprain of unspecified ligament of left ankle, subsequent encounter: Secondary | ICD-10-CM | POA: Diagnosis not present

## 2016-08-17 DIAGNOSIS — M25572 Pain in left ankle and joints of left foot: Secondary | ICD-10-CM | POA: Diagnosis not present

## 2016-08-17 DIAGNOSIS — M7672 Peroneal tendinitis, left leg: Secondary | ICD-10-CM | POA: Diagnosis not present

## 2016-08-26 DIAGNOSIS — I10 Essential (primary) hypertension: Secondary | ICD-10-CM | POA: Diagnosis not present

## 2016-08-26 DIAGNOSIS — M2540 Effusion, unspecified joint: Secondary | ICD-10-CM | POA: Diagnosis not present

## 2016-08-26 DIAGNOSIS — M542 Cervicalgia: Secondary | ICD-10-CM | POA: Diagnosis not present

## 2016-09-02 DIAGNOSIS — S134XXA Sprain of ligaments of cervical spine, initial encounter: Secondary | ICD-10-CM | POA: Diagnosis not present

## 2016-09-11 DIAGNOSIS — S134XXA Sprain of ligaments of cervical spine, initial encounter: Secondary | ICD-10-CM | POA: Diagnosis not present

## 2016-09-20 DIAGNOSIS — S134XXA Sprain of ligaments of cervical spine, initial encounter: Secondary | ICD-10-CM | POA: Diagnosis not present

## 2016-09-23 DIAGNOSIS — S134XXA Sprain of ligaments of cervical spine, initial encounter: Secondary | ICD-10-CM | POA: Diagnosis not present

## 2016-09-26 DIAGNOSIS — S134XXA Sprain of ligaments of cervical spine, initial encounter: Secondary | ICD-10-CM | POA: Diagnosis not present

## 2016-10-09 DIAGNOSIS — S134XXA Sprain of ligaments of cervical spine, initial encounter: Secondary | ICD-10-CM | POA: Diagnosis not present

## 2016-10-11 DIAGNOSIS — S134XXA Sprain of ligaments of cervical spine, initial encounter: Secondary | ICD-10-CM | POA: Diagnosis not present

## 2016-10-14 DIAGNOSIS — S134XXA Sprain of ligaments of cervical spine, initial encounter: Secondary | ICD-10-CM | POA: Diagnosis not present

## 2016-10-19 DIAGNOSIS — S134XXA Sprain of ligaments of cervical spine, initial encounter: Secondary | ICD-10-CM | POA: Diagnosis not present

## 2016-10-22 DIAGNOSIS — S134XXA Sprain of ligaments of cervical spine, initial encounter: Secondary | ICD-10-CM | POA: Diagnosis not present

## 2016-10-25 DIAGNOSIS — S134XXA Sprain of ligaments of cervical spine, initial encounter: Secondary | ICD-10-CM | POA: Diagnosis not present

## 2016-10-28 DIAGNOSIS — S134XXA Sprain of ligaments of cervical spine, initial encounter: Secondary | ICD-10-CM | POA: Diagnosis not present

## 2016-10-31 DIAGNOSIS — S134XXA Sprain of ligaments of cervical spine, initial encounter: Secondary | ICD-10-CM | POA: Diagnosis not present

## 2016-11-09 DIAGNOSIS — S134XXA Sprain of ligaments of cervical spine, initial encounter: Secondary | ICD-10-CM | POA: Diagnosis not present

## 2016-11-16 DIAGNOSIS — S134XXA Sprain of ligaments of cervical spine, initial encounter: Secondary | ICD-10-CM | POA: Diagnosis not present

## 2016-11-23 DIAGNOSIS — S134XXA Sprain of ligaments of cervical spine, initial encounter: Secondary | ICD-10-CM | POA: Diagnosis not present

## 2016-11-25 DIAGNOSIS — M6588 Other synovitis and tenosynovitis, other site: Secondary | ICD-10-CM | POA: Diagnosis not present

## 2016-11-25 DIAGNOSIS — M7672 Peroneal tendinitis, left leg: Secondary | ICD-10-CM | POA: Diagnosis not present

## 2016-11-25 DIAGNOSIS — M25572 Pain in left ankle and joints of left foot: Secondary | ICD-10-CM | POA: Diagnosis not present

## 2017-03-17 ENCOUNTER — Other Ambulatory Visit: Payer: Self-pay | Admitting: Gastroenterology

## 2017-03-17 DIAGNOSIS — R1011 Right upper quadrant pain: Secondary | ICD-10-CM | POA: Diagnosis not present

## 2017-03-17 DIAGNOSIS — Z86010 Personal history of colonic polyps: Secondary | ICD-10-CM | POA: Diagnosis not present

## 2017-03-17 DIAGNOSIS — K219 Gastro-esophageal reflux disease without esophagitis: Secondary | ICD-10-CM | POA: Diagnosis not present

## 2017-03-24 ENCOUNTER — Ambulatory Visit
Admission: RE | Admit: 2017-03-24 | Discharge: 2017-03-24 | Disposition: A | Discharge status: Home / Self Care | Payer: Self-pay | Source: Ambulatory Visit | Attending: Gastroenterology | Admitting: Gastroenterology

## 2017-03-24 DIAGNOSIS — Z1231 Encounter for screening mammogram for malignant neoplasm of breast: Secondary | ICD-10-CM | POA: Diagnosis not present

## 2017-03-24 DIAGNOSIS — R109 Unspecified abdominal pain: Secondary | ICD-10-CM | POA: Diagnosis not present

## 2017-03-24 DIAGNOSIS — R1011 Right upper quadrant pain: Secondary | ICD-10-CM

## 2017-03-27 DIAGNOSIS — R922 Inconclusive mammogram: Secondary | ICD-10-CM | POA: Diagnosis not present

## 2017-03-27 DIAGNOSIS — R928 Other abnormal and inconclusive findings on diagnostic imaging of breast: Secondary | ICD-10-CM | POA: Diagnosis not present

## 2017-04-02 ENCOUNTER — Other Ambulatory Visit: Payer: Self-pay | Admitting: Radiology

## 2017-04-02 DIAGNOSIS — N6089 Other benign mammary dysplasias of unspecified breast: Secondary | ICD-10-CM | POA: Diagnosis not present

## 2017-04-02 DIAGNOSIS — R599 Enlarged lymph nodes, unspecified: Secondary | ICD-10-CM | POA: Diagnosis not present

## 2017-04-02 DIAGNOSIS — N62 Hypertrophy of breast: Secondary | ICD-10-CM | POA: Diagnosis not present

## 2017-04-15 DIAGNOSIS — M2550 Pain in unspecified joint: Secondary | ICD-10-CM | POA: Diagnosis not present

## 2017-04-17 DIAGNOSIS — L821 Other seborrheic keratosis: Secondary | ICD-10-CM | POA: Diagnosis not present

## 2017-04-17 DIAGNOSIS — L820 Inflamed seborrheic keratosis: Secondary | ICD-10-CM | POA: Diagnosis not present

## 2017-04-17 DIAGNOSIS — L579 Skin changes due to chronic exposure to nonionizing radiation, unspecified: Secondary | ICD-10-CM | POA: Diagnosis not present

## 2017-04-17 DIAGNOSIS — L814 Other melanin hyperpigmentation: Secondary | ICD-10-CM | POA: Diagnosis not present

## 2017-04-21 DIAGNOSIS — K5730 Diverticulosis of large intestine without perforation or abscess without bleeding: Secondary | ICD-10-CM | POA: Diagnosis not present

## 2017-04-21 DIAGNOSIS — Z86010 Personal history of colonic polyps: Secondary | ICD-10-CM | POA: Diagnosis not present

## 2017-04-21 DIAGNOSIS — D126 Benign neoplasm of colon, unspecified: Secondary | ICD-10-CM | POA: Diagnosis not present

## 2017-04-24 DIAGNOSIS — D126 Benign neoplasm of colon, unspecified: Secondary | ICD-10-CM | POA: Diagnosis not present

## 2017-05-07 DIAGNOSIS — M9902 Segmental and somatic dysfunction of thoracic region: Secondary | ICD-10-CM | POA: Diagnosis not present

## 2017-05-07 DIAGNOSIS — M9903 Segmental and somatic dysfunction of lumbar region: Secondary | ICD-10-CM | POA: Diagnosis not present

## 2017-05-07 DIAGNOSIS — M5116 Intervertebral disc disorders with radiculopathy, lumbar region: Secondary | ICD-10-CM | POA: Diagnosis not present

## 2017-05-07 DIAGNOSIS — M9905 Segmental and somatic dysfunction of pelvic region: Secondary | ICD-10-CM | POA: Diagnosis not present

## 2017-05-09 DIAGNOSIS — M5116 Intervertebral disc disorders with radiculopathy, lumbar region: Secondary | ICD-10-CM | POA: Diagnosis not present

## 2017-05-09 DIAGNOSIS — M9905 Segmental and somatic dysfunction of pelvic region: Secondary | ICD-10-CM | POA: Diagnosis not present

## 2017-05-09 DIAGNOSIS — M9902 Segmental and somatic dysfunction of thoracic region: Secondary | ICD-10-CM | POA: Diagnosis not present

## 2017-05-09 DIAGNOSIS — M9903 Segmental and somatic dysfunction of lumbar region: Secondary | ICD-10-CM | POA: Diagnosis not present

## 2017-05-12 DIAGNOSIS — M9903 Segmental and somatic dysfunction of lumbar region: Secondary | ICD-10-CM | POA: Diagnosis not present

## 2017-05-12 DIAGNOSIS — M9905 Segmental and somatic dysfunction of pelvic region: Secondary | ICD-10-CM | POA: Diagnosis not present

## 2017-05-12 DIAGNOSIS — M9902 Segmental and somatic dysfunction of thoracic region: Secondary | ICD-10-CM | POA: Diagnosis not present

## 2017-05-12 DIAGNOSIS — M5116 Intervertebral disc disorders with radiculopathy, lumbar region: Secondary | ICD-10-CM | POA: Diagnosis not present

## 2017-05-13 DIAGNOSIS — Z23 Encounter for immunization: Secondary | ICD-10-CM | POA: Diagnosis not present

## 2017-05-15 DIAGNOSIS — M9903 Segmental and somatic dysfunction of lumbar region: Secondary | ICD-10-CM | POA: Diagnosis not present

## 2017-05-15 DIAGNOSIS — M9902 Segmental and somatic dysfunction of thoracic region: Secondary | ICD-10-CM | POA: Diagnosis not present

## 2017-05-15 DIAGNOSIS — M5116 Intervertebral disc disorders with radiculopathy, lumbar region: Secondary | ICD-10-CM | POA: Diagnosis not present

## 2017-05-15 DIAGNOSIS — M9905 Segmental and somatic dysfunction of pelvic region: Secondary | ICD-10-CM | POA: Diagnosis not present

## 2017-05-19 DIAGNOSIS — M9902 Segmental and somatic dysfunction of thoracic region: Secondary | ICD-10-CM | POA: Diagnosis not present

## 2017-05-19 DIAGNOSIS — M5116 Intervertebral disc disorders with radiculopathy, lumbar region: Secondary | ICD-10-CM | POA: Diagnosis not present

## 2017-05-19 DIAGNOSIS — M9905 Segmental and somatic dysfunction of pelvic region: Secondary | ICD-10-CM | POA: Diagnosis not present

## 2017-05-19 DIAGNOSIS — M9903 Segmental and somatic dysfunction of lumbar region: Secondary | ICD-10-CM | POA: Diagnosis not present

## 2017-05-22 DIAGNOSIS — M9902 Segmental and somatic dysfunction of thoracic region: Secondary | ICD-10-CM | POA: Diagnosis not present

## 2017-05-22 DIAGNOSIS — M5116 Intervertebral disc disorders with radiculopathy, lumbar region: Secondary | ICD-10-CM | POA: Diagnosis not present

## 2017-05-22 DIAGNOSIS — M9903 Segmental and somatic dysfunction of lumbar region: Secondary | ICD-10-CM | POA: Diagnosis not present

## 2017-05-22 DIAGNOSIS — M9905 Segmental and somatic dysfunction of pelvic region: Secondary | ICD-10-CM | POA: Diagnosis not present

## 2017-05-26 DIAGNOSIS — M9903 Segmental and somatic dysfunction of lumbar region: Secondary | ICD-10-CM | POA: Diagnosis not present

## 2017-05-26 DIAGNOSIS — M9902 Segmental and somatic dysfunction of thoracic region: Secondary | ICD-10-CM | POA: Diagnosis not present

## 2017-05-26 DIAGNOSIS — M9905 Segmental and somatic dysfunction of pelvic region: Secondary | ICD-10-CM | POA: Diagnosis not present

## 2017-05-26 DIAGNOSIS — M5116 Intervertebral disc disorders with radiculopathy, lumbar region: Secondary | ICD-10-CM | POA: Diagnosis not present

## 2017-05-27 DIAGNOSIS — R1030 Lower abdominal pain, unspecified: Secondary | ICD-10-CM | POA: Diagnosis not present

## 2017-06-05 DIAGNOSIS — M5116 Intervertebral disc disorders with radiculopathy, lumbar region: Secondary | ICD-10-CM | POA: Diagnosis not present

## 2017-06-05 DIAGNOSIS — M9903 Segmental and somatic dysfunction of lumbar region: Secondary | ICD-10-CM | POA: Diagnosis not present

## 2017-06-05 DIAGNOSIS — M9902 Segmental and somatic dysfunction of thoracic region: Secondary | ICD-10-CM | POA: Diagnosis not present

## 2017-06-05 DIAGNOSIS — M9905 Segmental and somatic dysfunction of pelvic region: Secondary | ICD-10-CM | POA: Diagnosis not present

## 2017-06-12 DIAGNOSIS — M5116 Intervertebral disc disorders with radiculopathy, lumbar region: Secondary | ICD-10-CM | POA: Diagnosis not present

## 2017-06-12 DIAGNOSIS — M9902 Segmental and somatic dysfunction of thoracic region: Secondary | ICD-10-CM | POA: Diagnosis not present

## 2017-06-12 DIAGNOSIS — M9905 Segmental and somatic dysfunction of pelvic region: Secondary | ICD-10-CM | POA: Diagnosis not present

## 2017-06-12 DIAGNOSIS — M9903 Segmental and somatic dysfunction of lumbar region: Secondary | ICD-10-CM | POA: Diagnosis not present

## 2017-06-13 DIAGNOSIS — M2550 Pain in unspecified joint: Secondary | ICD-10-CM | POA: Diagnosis not present

## 2017-06-18 DIAGNOSIS — M9905 Segmental and somatic dysfunction of pelvic region: Secondary | ICD-10-CM | POA: Diagnosis not present

## 2017-06-18 DIAGNOSIS — M5116 Intervertebral disc disorders with radiculopathy, lumbar region: Secondary | ICD-10-CM | POA: Diagnosis not present

## 2017-06-18 DIAGNOSIS — M9903 Segmental and somatic dysfunction of lumbar region: Secondary | ICD-10-CM | POA: Diagnosis not present

## 2017-06-18 DIAGNOSIS — M9902 Segmental and somatic dysfunction of thoracic region: Secondary | ICD-10-CM | POA: Diagnosis not present

## 2017-06-25 DIAGNOSIS — M9903 Segmental and somatic dysfunction of lumbar region: Secondary | ICD-10-CM | POA: Diagnosis not present

## 2017-06-25 DIAGNOSIS — M9902 Segmental and somatic dysfunction of thoracic region: Secondary | ICD-10-CM | POA: Diagnosis not present

## 2017-06-25 DIAGNOSIS — M9905 Segmental and somatic dysfunction of pelvic region: Secondary | ICD-10-CM | POA: Diagnosis not present

## 2017-06-25 DIAGNOSIS — M5116 Intervertebral disc disorders with radiculopathy, lumbar region: Secondary | ICD-10-CM | POA: Diagnosis not present

## 2017-08-12 DIAGNOSIS — G2581 Restless legs syndrome: Secondary | ICD-10-CM | POA: Diagnosis not present

## 2017-08-12 DIAGNOSIS — Z131 Encounter for screening for diabetes mellitus: Secondary | ICD-10-CM | POA: Diagnosis not present

## 2017-08-12 DIAGNOSIS — Z13220 Encounter for screening for lipoid disorders: Secondary | ICD-10-CM | POA: Diagnosis not present

## 2017-08-12 DIAGNOSIS — Z0000 Encounter for general adult medical examination without abnormal findings: Secondary | ICD-10-CM | POA: Diagnosis not present

## 2017-08-12 DIAGNOSIS — I10 Essential (primary) hypertension: Secondary | ICD-10-CM | POA: Diagnosis not present

## 2017-09-18 ENCOUNTER — Ambulatory Visit (INDEPENDENT_AMBULATORY_CARE_PROVIDER_SITE_OTHER): Payer: BLUE CROSS/BLUE SHIELD

## 2017-09-18 ENCOUNTER — Encounter: Payer: Self-pay | Admitting: Podiatry

## 2017-09-18 ENCOUNTER — Ambulatory Visit: Payer: BLUE CROSS/BLUE SHIELD | Admitting: Podiatry

## 2017-09-18 DIAGNOSIS — M25572 Pain in left ankle and joints of left foot: Secondary | ICD-10-CM

## 2017-09-18 DIAGNOSIS — T148XXA Other injury of unspecified body region, initial encounter: Secondary | ICD-10-CM | POA: Diagnosis not present

## 2017-09-18 DIAGNOSIS — G8929 Other chronic pain: Secondary | ICD-10-CM | POA: Diagnosis not present

## 2017-09-18 DIAGNOSIS — M779 Enthesopathy, unspecified: Secondary | ICD-10-CM | POA: Diagnosis not present

## 2017-09-18 NOTE — Progress Notes (Signed)
Subjective:    Patient ID: Carol Hamilton, female    DOB: [birth year], 52 y.o.   MRN: [id number]  HPI  52 year old female presents the office today for concerns of pain to the outside aspect the left foot which is been ongoing for several years.  She has recently been under the care of Dr. Doran Durand at [facility] for this.  She did have one injection she points to the fifth metatarsal base which did help for short amount of time.  She continues to get pain to the outside aspect of her foot and ankle mostly when when on uneven surfaces.  She did also have an MRI and she was told she had some tearing along the peroneal tendon but at that time surgery is not recommended.  She is also complaining that she has as well as Achilles tendinitis which she continues to have some symptoms with.  She gets swelling to the outside aspect of the foot and ankle occasionally but no redness or warmth.  She does wear inserts which do help.  She has no other concerns today.  Review of Systems  All other systems reviewed and are negative.  Past Medical History:  Diagnosis Date  . Adhesive capsulitis of left shoulder   . GERD (gastroesophageal reflux disease)   . Headache(784.0)    Migraines  . History of endometriosis    left ovary--  s/p  lso  2012  . Hypertension   . Labral tear of shoulder    LEFT  . OA (osteoarthritis) of knee    RIGHT  . PVC's (premature ventricular contractions)   . RLS (restless legs syndrome)   . Wears glasses     Past Surgical History:  Procedure Laterality Date  . FOOT SURGERY  child   FBO-removed needle left foot  . KNEE ARTHROSCOPY WITH ANTERIOR CRUCIATE LIGAMENT (ACL) REPAIR Right 2013  . KNEE ARTHROSCOPY WITH MEDIAL MENISECTOMY Right 12/16/2013   Procedure: I & D Mass with excisional biopsy right knee;  Surgeon: Sydnee Cabal, MD;  Location: [place];  Service: Orthopedics;  Laterality: Right;  . LAPAROSCOPIC CHOLECYSTECTOMY  2008    [facility]  . ROBOT ASSISTED LEFT SALPINGOOPHORECTOMY /  RIGHT SALPINGECTOMY  06-11-2011  . SHOULDER ARTHROSCOPY W/ LABRAL REPAIR Right 2004   [name]  . SHOULDER ARTHROSCOPY WITH SUBACROMIAL DECOMPRESSION, ROTATOR CUFF REPAIR AND BICEP TENDON REPAIR Left 02/03/2014   Procedure: LEFT SHOULDER ARTHROSCOPY WITH MANIPULATION UNDER ANESTHESIA/RELEASES SUBACROMIAL DECOMPRESSION/DISTAL CLAVICLE RESECTION/LABRAL DEBRIDEMENT.;  Surgeon: Sydnee Cabal, MD;  Location: [place];  Service: Orthopedics;  Laterality: Left;  ANESTHESIA:  GENERAL/INTERSCALENE BLOCK     Current Outpatient Medications:  .  acebutolol (SECTRAL) 200 MG capsule, Take 200 mg by mouth every morning. , Disp: , Rfl:  .  ciprofloxacin (CIPRO) 500 MG tablet, Take 500 mg by mouth 2 (two) times daily. For 10 days starting 03/12/14., Disp: , Rfl:  .  famotidine (PEPCID) 20 MG tablet, Take 20 mg by mouth daily as needed for heartburn or indigestion. , Disp: , Rfl:  .  lisinopril-hydrochlorothiazide (PRINZIDE,ZESTORETIC) 10-12.5 MG per tablet, Take 1 tablet by mouth daily., Disp: , Rfl:  .  metroNIDAZOLE (FLAGYL) 500 MG tablet, Take 500 mg by mouth 3 (three) times daily. For 10 days., Disp: , Rfl:  .  ondansetron (ZOFRAN ODT) 4 MG disintegrating tablet, Take 1 tablet (4 mg total) by mouth every 8 (eight) hours as needed for nausea or vomiting. (Patient not taking: Reported on 09/18/2017), Disp:  20 tablet, Rfl: 0 .  rizatriptan (MAXALT-MLT) 10 MG disintegrating tablet, Take 10 mg by mouth as needed. May repeat in 2 hours if needed..headache, Disp: , Rfl:   Allergies  Allergen Reactions  . Adhesive [Tape] Other (See Comments)    Blisters/    ALSO EKG LEAD ADHESIVE CAUSES BLISTERING  . Amoxicillin Nausea And Vomiting    Severe   . Augmentin [Amoxicillin-Pot Clavulanate]   . Azithromycin Swelling  . Erythromycin Swelling  . Flagyl [Metronidazole]   . Sulfa Antibiotics Rash    Social History   Socioeconomic  History  . Marital status: Married    Spouse name: Not on file  . Number of children: Not on file  . Years of education: Not on file  . Highest education level: Not on file  Social Needs  . Financial resource strain: Not on file  . Food insecurity - worry: Not on file  . Food insecurity - inability: Not on file  . Transportation needs - medical: Not on file  . Transportation needs - non-medical: Not on file  Occupational History  . Not on file  Tobacco Use  . Smoking status: Never Smoker  . Smokeless tobacco: Never Used  Substance and Sexual Activity  . Alcohol use: Yes    Alcohol/week: 0.5 oz    Types: 1 drink(s) per week    Comment: occasionally   . Drug use: No  . Sexual activity: Yes    Comment: Spouse with Vasectomy  Other Topics Concern  . Not on file  Social History Narrative  . Not on file        Objective:   Physical Exam  General: AAO x3, NAD  Dermatological: Skin is warm, dry and supple bilateral. Nails x 10 are well manicured; remaining integument appears unremarkable at this time. There are no open sores, no preulcerative lesions, no rash or signs of infection present.  Vascular: Dorsalis Pedis artery and Posterior Tibial artery pedal pulses are 2/4 bilateral with immedate capillary fill time. There is no pain with calf compression, swelling, warmth, erythema.   Neruologic: Grossly intact via light touch bilateral. Protective threshold with Semmes Wienstein monofilament intact to all pedal sites bilateral.   Musculoskeletal: There is tenderness on the course of the peroneal tendon just posterior and inferior to the lateral malleolus along the insertion of the fifth metatarsal base.  The tendon appears to be intact.  There is mild edema to the area.  There is a fatty mass present on the sinus tarsi the lateral aspect the foot.  There is no pain or crepitation with ankle or subtalar joint range of motion.  Mild discomfort on the Achilles tendon but [place] Silos test  is negative.  No pain on the course or insertion of the plantar fascia today.  There is no area pinpoint bony tenderness or pain to vibratory sensation.  No erythema or increase in warmth.  Muscular strength 5/5 in all groups tested bilateral.  Gait: Unassisted, Nonantalgic.      Assessment & Plan:  52 year old female with chronic left lateral ankle pain, tendinitis -Treatment options discussed including all alternatives, risks, and complications -Etiology of symptoms were discussed -X-rays were obtained and reviewed with the patient. No definitive evidence of acute fracture identified today -At this point given the fact that she was told she had.  The peroneal tendon she had symptoms over the last year to get a repeat MRI.  This was ordered for her today.  Also will try to get her  records to see her old MRI report.  If there is any worsening of the tearing may need to consider surgery at this point however if it is not healing we can continue conservative treatment.  We discussed various treatment options and I think at this point she would be a good candidate for EPAT.  I discussed this is not a guarantee resolution of symptoms and she understands that she should be willing to try it.  Also I did add a lateral posterior orthotics she feels that she needs something on the orthotic to raise the outside part of her foot.  If this is well we can consider making a new insert.  Follow-up after MRI or sooner if any issues are to arise.  Trula Slade DPM

## 2017-09-22 ENCOUNTER — Telehealth: Payer: Self-pay | Admitting: *Deleted

## 2017-09-22 DIAGNOSIS — G8929 Other chronic pain: Secondary | ICD-10-CM

## 2017-09-22 DIAGNOSIS — T148XXA Other injury of unspecified body region, initial encounter: Secondary | ICD-10-CM

## 2017-09-22 DIAGNOSIS — M25572 Pain in left ankle and joints of left foot: Secondary | ICD-10-CM

## 2017-09-22 DIAGNOSIS — M779 Enthesopathy, unspecified: Secondary | ICD-10-CM

## 2017-09-22 NOTE — Telephone Encounter (Signed)
Orders to J. Quintana, RN for pre-cert and faxed to [facility]. 

## 2017-09-22 NOTE — Telephone Encounter (Signed)
-----   Message from Trula Slade, DPM sent at 09/21/2017 10:06 AM EST ----- Continue please order an MRI of the left ankle to rule out peroneal tendon tear.  See if they can image all the way to the fifth metatarsal base.  Thank you  She had an MRI done 1 year ago that revealed tearing of the peroneal tendon but she continues to have pain.  Old MRI was done at Dr. Nona Dell office

## 2017-09-30 DIAGNOSIS — R922 Inconclusive mammogram: Secondary | ICD-10-CM | POA: Diagnosis not present

## 2017-10-03 ENCOUNTER — Ambulatory Visit
Admission: RE | Admit: 2017-10-03 | Discharge: 2017-10-03 | Disposition: A | Discharge status: Home / Self Care | Payer: BLUE CROSS/BLUE SHIELD | Source: Ambulatory Visit | Attending: Podiatry | Admitting: Podiatry

## 2017-10-03 DIAGNOSIS — S86312A Strain of muscle(s) and tendon(s) of peroneal muscle group at lower leg level, left leg, initial encounter: Secondary | ICD-10-CM | POA: Diagnosis not present

## 2017-10-10 ENCOUNTER — Ambulatory Visit (INDEPENDENT_AMBULATORY_CARE_PROVIDER_SITE_OTHER): Payer: Self-pay | Admitting: Podiatry

## 2017-10-10 DIAGNOSIS — M79676 Pain in unspecified toe(s): Secondary | ICD-10-CM

## 2017-10-10 DIAGNOSIS — M25572 Pain in left ankle and joints of left foot: Secondary | ICD-10-CM

## 2017-10-10 DIAGNOSIS — M779 Enthesopathy, unspecified: Secondary | ICD-10-CM

## 2017-10-10 DIAGNOSIS — G8929 Other chronic pain: Secondary | ICD-10-CM

## 2017-10-13 DIAGNOSIS — M2550 Pain in unspecified joint: Secondary | ICD-10-CM | POA: Diagnosis not present

## 2017-10-13 DIAGNOSIS — M9904 Segmental and somatic dysfunction of sacral region: Secondary | ICD-10-CM | POA: Diagnosis not present

## 2017-10-13 DIAGNOSIS — M9903 Segmental and somatic dysfunction of lumbar region: Secondary | ICD-10-CM | POA: Diagnosis not present

## 2017-10-13 DIAGNOSIS — M9905 Segmental and somatic dysfunction of pelvic region: Secondary | ICD-10-CM | POA: Diagnosis not present

## 2017-10-13 DIAGNOSIS — M9902 Segmental and somatic dysfunction of thoracic region: Secondary | ICD-10-CM | POA: Diagnosis not present

## 2017-10-13 NOTE — Progress Notes (Signed)
Patient presents with ongoing pain on the outside of her left foot that radiates upward along the lateral side of her ankle.    Pain on palpation to plantar aspect and lateral side of her left foot. No other tender areas noted, no pain to heel when pressed.    ESWT administered at 4 joules and tolerated well. EPAT administered to entire plantar fascia. Overall she tolerated the procedure very well. Advised to use her boot and avoid NSAIDs and ice. Follow up in 1 week for 2nd treatment

## 2017-10-16 ENCOUNTER — Ambulatory Visit: Payer: Self-pay

## 2017-10-16 DIAGNOSIS — M9904 Segmental and somatic dysfunction of sacral region: Secondary | ICD-10-CM | POA: Diagnosis not present

## 2017-10-16 DIAGNOSIS — M9902 Segmental and somatic dysfunction of thoracic region: Secondary | ICD-10-CM | POA: Diagnosis not present

## 2017-10-16 DIAGNOSIS — M9903 Segmental and somatic dysfunction of lumbar region: Secondary | ICD-10-CM | POA: Diagnosis not present

## 2017-10-16 DIAGNOSIS — M79676 Pain in unspecified toe(s): Secondary | ICD-10-CM

## 2017-10-16 DIAGNOSIS — M9905 Segmental and somatic dysfunction of pelvic region: Secondary | ICD-10-CM | POA: Diagnosis not present

## 2017-10-16 DIAGNOSIS — M779 Enthesopathy, unspecified: Secondary | ICD-10-CM

## 2017-10-20 DIAGNOSIS — M9905 Segmental and somatic dysfunction of pelvic region: Secondary | ICD-10-CM | POA: Diagnosis not present

## 2017-10-20 DIAGNOSIS — M9904 Segmental and somatic dysfunction of sacral region: Secondary | ICD-10-CM | POA: Diagnosis not present

## 2017-10-20 DIAGNOSIS — M9903 Segmental and somatic dysfunction of lumbar region: Secondary | ICD-10-CM | POA: Diagnosis not present

## 2017-10-20 DIAGNOSIS — M9902 Segmental and somatic dysfunction of thoracic region: Secondary | ICD-10-CM | POA: Diagnosis not present

## 2017-10-22 DIAGNOSIS — M9904 Segmental and somatic dysfunction of sacral region: Secondary | ICD-10-CM | POA: Diagnosis not present

## 2017-10-22 DIAGNOSIS — M9905 Segmental and somatic dysfunction of pelvic region: Secondary | ICD-10-CM | POA: Diagnosis not present

## 2017-10-22 DIAGNOSIS — M9903 Segmental and somatic dysfunction of lumbar region: Secondary | ICD-10-CM | POA: Diagnosis not present

## 2017-10-22 DIAGNOSIS — M9902 Segmental and somatic dysfunction of thoracic region: Secondary | ICD-10-CM | POA: Diagnosis not present

## 2017-10-24 ENCOUNTER — Ambulatory Visit (INDEPENDENT_AMBULATORY_CARE_PROVIDER_SITE_OTHER): Payer: BLUE CROSS/BLUE SHIELD

## 2017-10-24 DIAGNOSIS — M79676 Pain in unspecified toe(s): Secondary | ICD-10-CM

## 2017-10-24 DIAGNOSIS — M779 Enthesopathy, unspecified: Secondary | ICD-10-CM

## 2017-10-28 DIAGNOSIS — I10 Essential (primary) hypertension: Secondary | ICD-10-CM | POA: Diagnosis not present

## 2017-10-28 DIAGNOSIS — M9905 Segmental and somatic dysfunction of pelvic region: Secondary | ICD-10-CM | POA: Diagnosis not present

## 2017-10-28 DIAGNOSIS — M9903 Segmental and somatic dysfunction of lumbar region: Secondary | ICD-10-CM | POA: Diagnosis not present

## 2017-10-28 DIAGNOSIS — M9902 Segmental and somatic dysfunction of thoracic region: Secondary | ICD-10-CM | POA: Diagnosis not present

## 2017-10-28 DIAGNOSIS — M9904 Segmental and somatic dysfunction of sacral region: Secondary | ICD-10-CM | POA: Diagnosis not present

## 2017-10-28 DIAGNOSIS — M7989 Other specified soft tissue disorders: Secondary | ICD-10-CM | POA: Diagnosis not present

## 2017-10-28 NOTE — Progress Notes (Signed)
Patient presents with ongoing pain on the outside of her left foot that radiates upward along the lateral side of her ankle.    Pain on palpation to plantar aspect and lateral side of her left foot. No other tender areas noted, no pain to heel when pressed.    ESWT administered at 6 joules and tolerated well. EPAT administered to entire plantar fascia. Overall she tolerated the procedure very well. Advised to use her boot and avoid NSAIDs and ice. Follow up in 1 week for 3rd  treatment

## 2017-10-29 ENCOUNTER — Other Ambulatory Visit: Payer: Self-pay | Admitting: Physician Assistant

## 2017-10-29 DIAGNOSIS — M7989 Other specified soft tissue disorders: Secondary | ICD-10-CM

## 2017-11-03 ENCOUNTER — Ambulatory Visit
Admission: RE | Admit: 2017-11-03 | Discharge: 2017-11-03 | Disposition: A | Discharge status: Home / Self Care | Payer: BLUE CROSS/BLUE SHIELD | Source: Ambulatory Visit | Attending: Physician Assistant | Admitting: Physician Assistant

## 2017-11-03 DIAGNOSIS — M7989 Other specified soft tissue disorders: Secondary | ICD-10-CM

## 2017-11-05 NOTE — Progress Notes (Signed)
Patient presents with ongoing pain on the outside of her left foot that radiates upward along the lateral side of her ankle. She states that she has good and bad days.  Pain on palpation to plantar aspect and lateral side of her left foot. No other tender areas noted, no pain to heel when pressed.    ESWT administered at 6 joules and tolerated well. EPAT administered to entire plantar fascia. Overall she tolerated the procedure very well. Advised to use her boot and avoid NSAIDs and ice. Follow up in 4 weeks for 4th  treatment

## 2017-11-10 DIAGNOSIS — M9904 Segmental and somatic dysfunction of sacral region: Secondary | ICD-10-CM | POA: Diagnosis not present

## 2017-11-10 DIAGNOSIS — M9903 Segmental and somatic dysfunction of lumbar region: Secondary | ICD-10-CM | POA: Diagnosis not present

## 2017-11-10 DIAGNOSIS — M9902 Segmental and somatic dysfunction of thoracic region: Secondary | ICD-10-CM | POA: Diagnosis not present

## 2017-11-10 DIAGNOSIS — M9905 Segmental and somatic dysfunction of pelvic region: Secondary | ICD-10-CM | POA: Diagnosis not present

## 2017-11-17 DIAGNOSIS — M9902 Segmental and somatic dysfunction of thoracic region: Secondary | ICD-10-CM | POA: Diagnosis not present

## 2017-11-17 DIAGNOSIS — M9904 Segmental and somatic dysfunction of sacral region: Secondary | ICD-10-CM | POA: Diagnosis not present

## 2017-11-17 DIAGNOSIS — M9905 Segmental and somatic dysfunction of pelvic region: Secondary | ICD-10-CM | POA: Diagnosis not present

## 2017-11-17 DIAGNOSIS — M9903 Segmental and somatic dysfunction of lumbar region: Secondary | ICD-10-CM | POA: Diagnosis not present

## 2017-11-20 ENCOUNTER — Ambulatory Visit: Payer: Self-pay

## 2017-11-20 DIAGNOSIS — M79676 Pain in unspecified toe(s): Secondary | ICD-10-CM

## 2017-11-20 DIAGNOSIS — M779 Enthesopathy, unspecified: Secondary | ICD-10-CM

## 2017-11-25 NOTE — Progress Notes (Signed)
Patient presents with ongoing pain on the outside of her left foot that radiates upward along the lateral side of her ankle. She states that she has good and bad days and she can go for longer periods of time without pain. She said she did notice an intense sudden episode of pain on the lateral side of her ankle/foot but pain subside and there have been no other issues since  Pain on palpation to plantar aspect and lateral side of her left foot. No other tender areas noted, no pain to heel when pressed.    ESWT administered at 6 joules and tolerated well. EPAT administered to entire plantar fascia. Overall she tolerated the procedure very well. Advised to use her boot and avoid NSAIDs and ice. Follow up in 4 weeks for 4th  Treatment or sooner if complications arise.

## 2017-11-28 DIAGNOSIS — M9902 Segmental and somatic dysfunction of thoracic region: Secondary | ICD-10-CM | POA: Diagnosis not present

## 2017-11-28 DIAGNOSIS — M9905 Segmental and somatic dysfunction of pelvic region: Secondary | ICD-10-CM | POA: Diagnosis not present

## 2017-11-28 DIAGNOSIS — M9904 Segmental and somatic dysfunction of sacral region: Secondary | ICD-10-CM | POA: Diagnosis not present

## 2017-11-28 DIAGNOSIS — M9903 Segmental and somatic dysfunction of lumbar region: Secondary | ICD-10-CM | POA: Diagnosis not present

## 2017-12-08 ENCOUNTER — Ambulatory Visit (INDEPENDENT_AMBULATORY_CARE_PROVIDER_SITE_OTHER): Payer: BLUE CROSS/BLUE SHIELD

## 2017-12-08 DIAGNOSIS — M779 Enthesopathy, unspecified: Secondary | ICD-10-CM

## 2017-12-08 DIAGNOSIS — M79676 Pain in unspecified toe(s): Secondary | ICD-10-CM

## 2017-12-11 ENCOUNTER — Other Ambulatory Visit (HOSPITAL_BASED_OUTPATIENT_CLINIC_OR_DEPARTMENT_OTHER): Payer: Self-pay | Admitting: Physician Assistant

## 2017-12-11 ENCOUNTER — Encounter (HOSPITAL_BASED_OUTPATIENT_CLINIC_OR_DEPARTMENT_OTHER): Payer: Self-pay

## 2017-12-11 ENCOUNTER — Ambulatory Visit (HOSPITAL_BASED_OUTPATIENT_CLINIC_OR_DEPARTMENT_OTHER)
Admission: RE | Admit: 2017-12-11 | Discharge: 2017-12-11 | Disposition: A | Discharge status: Home / Self Care | Payer: BLUE CROSS/BLUE SHIELD | Source: Ambulatory Visit | Attending: Physician Assistant | Admitting: Physician Assistant

## 2017-12-11 DIAGNOSIS — N200 Calculus of kidney: Secondary | ICD-10-CM | POA: Insufficient documentation

## 2017-12-11 DIAGNOSIS — R109 Unspecified abdominal pain: Secondary | ICD-10-CM | POA: Diagnosis not present

## 2017-12-11 DIAGNOSIS — K760 Fatty (change of) liver, not elsewhere classified: Secondary | ICD-10-CM | POA: Diagnosis not present

## 2017-12-11 DIAGNOSIS — R1031 Right lower quadrant pain: Secondary | ICD-10-CM | POA: Diagnosis not present

## 2017-12-11 DIAGNOSIS — R1110 Vomiting, unspecified: Secondary | ICD-10-CM | POA: Diagnosis not present

## 2017-12-11 DIAGNOSIS — K5730 Diverticulosis of large intestine without perforation or abscess without bleeding: Secondary | ICD-10-CM | POA: Diagnosis not present

## 2017-12-11 DIAGNOSIS — R112 Nausea with vomiting, unspecified: Secondary | ICD-10-CM | POA: Diagnosis not present

## 2017-12-11 MED ORDER — IOPAMIDOL (ISOVUE-300) INJECTION 61%
100.0000 mL | Freq: Once | INTRAVENOUS | Status: AC | PRN
  Administered 2017-12-11: 100 mL via INTRAVENOUS

## 2017-12-16 NOTE — Progress Notes (Signed)
Patient presents with ongoing pain on the outside of her left foot that radiates upward along the lateral side of her ankle. She states that she has good and bad days and she can go for longer periods of time without pain. She said she did notice an intense sudden episode of pain on the lateral side of her ankle/foot but pain subside and there have been no other issues since  Pain on palpation to plantar aspect and lateral side of her left foot. No other tender areas noted, no pain to heel when pressed.    ESWT administered at 4 joules and tolerated well. EPAT administered to entire plantar fascia. Overall she tolerated the procedure very well. Advised to use her boot and avoid NSAIDs and ice. Follow up in 2 weeks for 6th treatment

## 2017-12-29 ENCOUNTER — Ambulatory Visit (INDEPENDENT_AMBULATORY_CARE_PROVIDER_SITE_OTHER): Payer: BLUE CROSS/BLUE SHIELD

## 2017-12-29 DIAGNOSIS — M79676 Pain in unspecified toe(s): Secondary | ICD-10-CM

## 2017-12-29 DIAGNOSIS — M779 Enthesopathy, unspecified: Secondary | ICD-10-CM

## 2018-01-06 NOTE — Progress Notes (Signed)
Patient presents with ongoing pain on the outside of her left foot that radiates upward along the lateral side of her ankle. She states that she has good and bad days and she can go for longer periods of time without pain. She said she did notice an intense sudden episode of pain on the lateral side of her ankle/foot but pain subside and there have been no other issues since  Pain on palpation to plantar aspect and lateral side of her left foot. No other tender areas noted, no pain to heel when pressed.    ESWT administered at 4 joules and tolerated well. EPAT administered to entire plantar fascia. Overall she tolerated the procedure very well. Advised to use her boot and avoid NSAIDs and ice. Follow up prn with Dr Jacqualyn Posey if symptoms fail to improve

## 2018-01-22 ENCOUNTER — Encounter: Payer: Self-pay | Admitting: Podiatry

## 2018-01-22 ENCOUNTER — Ambulatory Visit: Payer: BLUE CROSS/BLUE SHIELD | Admitting: Podiatry

## 2018-01-22 DIAGNOSIS — M25572 Pain in left ankle and joints of left foot: Secondary | ICD-10-CM

## 2018-01-22 DIAGNOSIS — M7752 Other enthesopathy of left foot: Secondary | ICD-10-CM

## 2018-01-22 DIAGNOSIS — G8929 Other chronic pain: Secondary | ICD-10-CM

## 2018-01-22 DIAGNOSIS — M79676 Pain in unspecified toe(s): Secondary | ICD-10-CM

## 2018-01-22 DIAGNOSIS — M779 Enthesopathy, unspecified: Secondary | ICD-10-CM

## 2018-01-23 NOTE — Progress Notes (Signed)
Subjective: 52 year old female presents the office today for follow-up evaluation of pain to the outside aspect of the left foot.  She states that overall she is doing better but she still has some discomfort.  She did complete 6 EPAT treatment she feels it was helpful more and she points on the base the fifth metatarsal but she still gets some discomfort posteriorly but she points proximal on the peroneal tendon where she gets discomfort.  She denies any recent injury or trauma.  She has no other concerns today. Denies any systemic complaints such as fevers, chills, nausea, vomiting. No acute changes since last appointment, and no other complaints at this time.   Objective: AAO x3, NAD DP/PT pulses palpable bilaterally, CRT less than 3 seconds There is some mild tenderness palpation of the course of the peroneal tendon mostly just posterior and inferior to the lateral malleolus and there is minimal swelling to the area.  There is no erythema or increase in warmth.  There is still some mild tenderness on the base of the fifth metatarsal on the insertion but overall this area is much better.  Tendon appears to be grossly intact.  There is no other areas of tenderness. No open lesions or pre-ulcerative lesions.  No pain with calf compression, swelling, warmth, erythema  Assessment: Peroneal tendinitis left side  Plan: -All treatment options discussed with the patient including all alternatives, risks, complications.  -We had a long discussion regards to further treatment options.  The majority of the EPAT treatments and focus more distally.  Remain due to more treatments along the more proximal aspect of the peroneal tendon. She completed another EPAT treatment today without complications. We will do another in another 2 weeks. After that will consider going back to PT for iontophoresis.  -Patient encouraged to call the office with any questions, concerns, change in symptoms.   Trula Slade  DPM

## 2018-02-06 ENCOUNTER — Other Ambulatory Visit: Payer: BLUE CROSS/BLUE SHIELD

## 2018-02-09 ENCOUNTER — Other Ambulatory Visit: Payer: BLUE CROSS/BLUE SHIELD

## 2018-02-09 DIAGNOSIS — I10 Essential (primary) hypertension: Secondary | ICD-10-CM | POA: Diagnosis not present

## 2018-02-09 DIAGNOSIS — Z01419 Encounter for gynecological examination (general) (routine) without abnormal findings: Secondary | ICD-10-CM | POA: Diagnosis not present

## 2018-02-09 DIAGNOSIS — Z124 Encounter for screening for malignant neoplasm of cervix: Secondary | ICD-10-CM | POA: Diagnosis not present

## 2018-02-10 ENCOUNTER — Telehealth: Payer: Self-pay | Admitting: *Deleted

## 2018-02-10 ENCOUNTER — Ambulatory Visit (INDEPENDENT_AMBULATORY_CARE_PROVIDER_SITE_OTHER): Payer: BLUE CROSS/BLUE SHIELD

## 2018-02-10 DIAGNOSIS — M779 Enthesopathy, unspecified: Secondary | ICD-10-CM

## 2018-02-10 DIAGNOSIS — B351 Tinea unguium: Secondary | ICD-10-CM

## 2018-02-10 NOTE — Telephone Encounter (Signed)
Orders faxed to [facility].

## 2018-02-11 DIAGNOSIS — M9905 Segmental and somatic dysfunction of pelvic region: Secondary | ICD-10-CM | POA: Diagnosis not present

## 2018-02-11 DIAGNOSIS — M9903 Segmental and somatic dysfunction of lumbar region: Secondary | ICD-10-CM | POA: Diagnosis not present

## 2018-02-11 DIAGNOSIS — M9904 Segmental and somatic dysfunction of sacral region: Secondary | ICD-10-CM | POA: Diagnosis not present

## 2018-02-11 DIAGNOSIS — M9902 Segmental and somatic dysfunction of thoracic region: Secondary | ICD-10-CM | POA: Diagnosis not present

## 2018-02-12 NOTE — Progress Notes (Signed)
Patient presents with ongoing pain on the outside of her left foot that radiates upward along the lateral side of her ankle. She states that she has good and bad days and she can go for longer periods of time without pain. She said she has notice an improvement in her ROM and her tendon did not feel as tight as before.     ESWT administered at 5 joules and tolerated well. EPAT administered to entire plantar fascia. Overall she tolerated the procedure very well. Advised to use her boot and avoid NSAIDs and ice. Order for [facility] faxed over today: eval/treat, iontophoresis. She is to follow up with Dr Jacqualyn Posey if pain persist after working with PT

## 2018-02-16 DIAGNOSIS — M9904 Segmental and somatic dysfunction of sacral region: Secondary | ICD-10-CM | POA: Diagnosis not present

## 2018-02-16 DIAGNOSIS — M9903 Segmental and somatic dysfunction of lumbar region: Secondary | ICD-10-CM | POA: Diagnosis not present

## 2018-02-16 DIAGNOSIS — M9902 Segmental and somatic dysfunction of thoracic region: Secondary | ICD-10-CM | POA: Diagnosis not present

## 2018-02-16 DIAGNOSIS — M9905 Segmental and somatic dysfunction of pelvic region: Secondary | ICD-10-CM | POA: Diagnosis not present

## 2018-02-19 DIAGNOSIS — M25521 Pain in right elbow: Secondary | ICD-10-CM | POA: Diagnosis not present

## 2018-02-19 DIAGNOSIS — M7701 Medial epicondylitis, right elbow: Secondary | ICD-10-CM | POA: Diagnosis not present

## 2018-02-19 DIAGNOSIS — M25511 Pain in right shoulder: Secondary | ICD-10-CM | POA: Diagnosis not present

## 2018-02-23 DIAGNOSIS — M9903 Segmental and somatic dysfunction of lumbar region: Secondary | ICD-10-CM | POA: Diagnosis not present

## 2018-02-23 DIAGNOSIS — M9904 Segmental and somatic dysfunction of sacral region: Secondary | ICD-10-CM | POA: Diagnosis not present

## 2018-02-23 DIAGNOSIS — M9902 Segmental and somatic dysfunction of thoracic region: Secondary | ICD-10-CM | POA: Diagnosis not present

## 2018-02-23 DIAGNOSIS — M9905 Segmental and somatic dysfunction of pelvic region: Secondary | ICD-10-CM | POA: Diagnosis not present

## 2018-02-24 DIAGNOSIS — S86392A Other injury of muscle(s) and tendon(s) of peroneal muscle group at lower leg level, left leg, initial encounter: Secondary | ICD-10-CM | POA: Diagnosis not present

## 2018-02-27 DIAGNOSIS — S86392A Other injury of muscle(s) and tendon(s) of peroneal muscle group at lower leg level, left leg, initial encounter: Secondary | ICD-10-CM | POA: Diagnosis not present

## 2018-03-02 DIAGNOSIS — S86392A Other injury of muscle(s) and tendon(s) of peroneal muscle group at lower leg level, left leg, initial encounter: Secondary | ICD-10-CM | POA: Diagnosis not present

## 2018-03-11 DIAGNOSIS — S86392A Other injury of muscle(s) and tendon(s) of peroneal muscle group at lower leg level, left leg, initial encounter: Secondary | ICD-10-CM | POA: Diagnosis not present

## 2018-03-13 DIAGNOSIS — I890 Lymphedema, not elsewhere classified: Secondary | ICD-10-CM | POA: Diagnosis not present

## 2018-03-13 DIAGNOSIS — M7701 Medial epicondylitis, right elbow: Secondary | ICD-10-CM | POA: Diagnosis not present

## 2018-03-13 DIAGNOSIS — M25311 Other instability, right shoulder: Secondary | ICD-10-CM | POA: Diagnosis not present

## 2018-03-16 DIAGNOSIS — S86392A Other injury of muscle(s) and tendon(s) of peroneal muscle group at lower leg level, left leg, initial encounter: Secondary | ICD-10-CM | POA: Diagnosis not present

## 2018-03-19 DIAGNOSIS — S86392A Other injury of muscle(s) and tendon(s) of peroneal muscle group at lower leg level, left leg, initial encounter: Secondary | ICD-10-CM | POA: Diagnosis not present

## 2018-03-20 DIAGNOSIS — I890 Lymphedema, not elsewhere classified: Secondary | ICD-10-CM | POA: Diagnosis not present

## 2018-03-20 DIAGNOSIS — M7701 Medial epicondylitis, right elbow: Secondary | ICD-10-CM | POA: Diagnosis not present

## 2018-03-20 DIAGNOSIS — M25311 Other instability, right shoulder: Secondary | ICD-10-CM | POA: Diagnosis not present

## 2018-03-24 DIAGNOSIS — S86392A Other injury of muscle(s) and tendon(s) of peroneal muscle group at lower leg level, left leg, initial encounter: Secondary | ICD-10-CM | POA: Diagnosis not present

## 2018-03-25 DIAGNOSIS — I890 Lymphedema, not elsewhere classified: Secondary | ICD-10-CM | POA: Diagnosis not present

## 2018-03-25 DIAGNOSIS — M7701 Medial epicondylitis, right elbow: Secondary | ICD-10-CM | POA: Diagnosis not present

## 2018-03-25 DIAGNOSIS — M25311 Other instability, right shoulder: Secondary | ICD-10-CM | POA: Diagnosis not present

## 2018-03-26 DIAGNOSIS — S86392A Other injury of muscle(s) and tendon(s) of peroneal muscle group at lower leg level, left leg, initial encounter: Secondary | ICD-10-CM | POA: Diagnosis not present

## 2018-03-27 DIAGNOSIS — I890 Lymphedema, not elsewhere classified: Secondary | ICD-10-CM | POA: Diagnosis not present

## 2018-03-27 DIAGNOSIS — M25311 Other instability, right shoulder: Secondary | ICD-10-CM | POA: Diagnosis not present

## 2018-03-27 DIAGNOSIS — M7701 Medial epicondylitis, right elbow: Secondary | ICD-10-CM | POA: Diagnosis not present

## 2018-03-31 DIAGNOSIS — S86392A Other injury of muscle(s) and tendon(s) of peroneal muscle group at lower leg level, left leg, initial encounter: Secondary | ICD-10-CM | POA: Diagnosis not present

## 2018-04-01 DIAGNOSIS — M25311 Other instability, right shoulder: Secondary | ICD-10-CM | POA: Diagnosis not present

## 2018-04-01 DIAGNOSIS — M7701 Medial epicondylitis, right elbow: Secondary | ICD-10-CM | POA: Diagnosis not present

## 2018-04-01 DIAGNOSIS — I890 Lymphedema, not elsewhere classified: Secondary | ICD-10-CM | POA: Diagnosis not present

## 2018-04-02 DIAGNOSIS — S86392A Other injury of muscle(s) and tendon(s) of peroneal muscle group at lower leg level, left leg, initial encounter: Secondary | ICD-10-CM | POA: Diagnosis not present

## 2018-04-06 DIAGNOSIS — S86392A Other injury of muscle(s) and tendon(s) of peroneal muscle group at lower leg level, left leg, initial encounter: Secondary | ICD-10-CM | POA: Diagnosis not present

## 2018-04-07 DIAGNOSIS — N6310 Unspecified lump in the right breast, unspecified quadrant: Secondary | ICD-10-CM | POA: Diagnosis not present

## 2018-04-07 DIAGNOSIS — R922 Inconclusive mammogram: Secondary | ICD-10-CM | POA: Diagnosis not present

## 2018-04-08 DIAGNOSIS — M25311 Other instability, right shoulder: Secondary | ICD-10-CM | POA: Diagnosis not present

## 2018-04-08 DIAGNOSIS — I890 Lymphedema, not elsewhere classified: Secondary | ICD-10-CM | POA: Diagnosis not present

## 2018-04-08 DIAGNOSIS — M7701 Medial epicondylitis, right elbow: Secondary | ICD-10-CM | POA: Diagnosis not present

## 2018-04-10 DIAGNOSIS — I890 Lymphedema, not elsewhere classified: Secondary | ICD-10-CM | POA: Diagnosis not present

## 2018-04-10 DIAGNOSIS — M25311 Other instability, right shoulder: Secondary | ICD-10-CM | POA: Diagnosis not present

## 2018-04-10 DIAGNOSIS — M7701 Medial epicondylitis, right elbow: Secondary | ICD-10-CM | POA: Diagnosis not present

## 2018-04-13 DIAGNOSIS — S86392A Other injury of muscle(s) and tendon(s) of peroneal muscle group at lower leg level, left leg, initial encounter: Secondary | ICD-10-CM | POA: Diagnosis not present

## 2018-04-15 DIAGNOSIS — M25311 Other instability, right shoulder: Secondary | ICD-10-CM | POA: Diagnosis not present

## 2018-04-15 DIAGNOSIS — M7701 Medial epicondylitis, right elbow: Secondary | ICD-10-CM | POA: Diagnosis not present

## 2018-04-15 DIAGNOSIS — I890 Lymphedema, not elsewhere classified: Secondary | ICD-10-CM | POA: Diagnosis not present

## 2018-04-20 DIAGNOSIS — D2361 Other benign neoplasm of skin of right upper limb, including shoulder: Secondary | ICD-10-CM | POA: Diagnosis not present

## 2018-04-20 DIAGNOSIS — S86392A Other injury of muscle(s) and tendon(s) of peroneal muscle group at lower leg level, left leg, initial encounter: Secondary | ICD-10-CM | POA: Diagnosis not present

## 2018-04-20 DIAGNOSIS — D1801 Hemangioma of skin and subcutaneous tissue: Secondary | ICD-10-CM | POA: Diagnosis not present

## 2018-04-20 DIAGNOSIS — L814 Other melanin hyperpigmentation: Secondary | ICD-10-CM | POA: Diagnosis not present

## 2018-04-20 DIAGNOSIS — L821 Other seborrheic keratosis: Secondary | ICD-10-CM | POA: Diagnosis not present

## 2018-04-22 DIAGNOSIS — M7701 Medial epicondylitis, right elbow: Secondary | ICD-10-CM | POA: Diagnosis not present

## 2018-04-22 DIAGNOSIS — M25311 Other instability, right shoulder: Secondary | ICD-10-CM | POA: Diagnosis not present

## 2018-04-22 DIAGNOSIS — I890 Lymphedema, not elsewhere classified: Secondary | ICD-10-CM | POA: Diagnosis not present

## 2018-04-28 DIAGNOSIS — S86392A Other injury of muscle(s) and tendon(s) of peroneal muscle group at lower leg level, left leg, initial encounter: Secondary | ICD-10-CM | POA: Diagnosis not present

## 2018-05-04 DIAGNOSIS — M9905 Segmental and somatic dysfunction of pelvic region: Secondary | ICD-10-CM | POA: Diagnosis not present

## 2018-05-04 DIAGNOSIS — M9902 Segmental and somatic dysfunction of thoracic region: Secondary | ICD-10-CM | POA: Diagnosis not present

## 2018-05-04 DIAGNOSIS — M9903 Segmental and somatic dysfunction of lumbar region: Secondary | ICD-10-CM | POA: Diagnosis not present

## 2018-05-04 DIAGNOSIS — M9904 Segmental and somatic dysfunction of sacral region: Secondary | ICD-10-CM | POA: Diagnosis not present

## 2018-05-08 DIAGNOSIS — M25311 Other instability, right shoulder: Secondary | ICD-10-CM | POA: Diagnosis not present

## 2018-05-08 DIAGNOSIS — M7701 Medial epicondylitis, right elbow: Secondary | ICD-10-CM | POA: Diagnosis not present

## 2018-05-08 DIAGNOSIS — I890 Lymphedema, not elsewhere classified: Secondary | ICD-10-CM | POA: Diagnosis not present

## 2018-05-11 DIAGNOSIS — M7701 Medial epicondylitis, right elbow: Secondary | ICD-10-CM | POA: Diagnosis not present

## 2018-05-11 DIAGNOSIS — I890 Lymphedema, not elsewhere classified: Secondary | ICD-10-CM | POA: Diagnosis not present

## 2018-05-11 DIAGNOSIS — M25311 Other instability, right shoulder: Secondary | ICD-10-CM | POA: Diagnosis not present

## 2018-05-15 DIAGNOSIS — I890 Lymphedema, not elsewhere classified: Secondary | ICD-10-CM | POA: Diagnosis not present

## 2018-05-15 DIAGNOSIS — M25311 Other instability, right shoulder: Secondary | ICD-10-CM | POA: Diagnosis not present

## 2018-05-15 DIAGNOSIS — M7701 Medial epicondylitis, right elbow: Secondary | ICD-10-CM | POA: Diagnosis not present

## 2018-05-19 DIAGNOSIS — M25311 Other instability, right shoulder: Secondary | ICD-10-CM | POA: Diagnosis not present

## 2018-05-19 DIAGNOSIS — M7701 Medial epicondylitis, right elbow: Secondary | ICD-10-CM | POA: Diagnosis not present

## 2018-05-19 DIAGNOSIS — I890 Lymphedema, not elsewhere classified: Secondary | ICD-10-CM | POA: Diagnosis not present

## 2018-05-22 DIAGNOSIS — M7701 Medial epicondylitis, right elbow: Secondary | ICD-10-CM | POA: Diagnosis not present

## 2018-05-22 DIAGNOSIS — M25311 Other instability, right shoulder: Secondary | ICD-10-CM | POA: Diagnosis not present

## 2018-05-22 DIAGNOSIS — I890 Lymphedema, not elsewhere classified: Secondary | ICD-10-CM | POA: Diagnosis not present

## 2018-05-26 DIAGNOSIS — M25311 Other instability, right shoulder: Secondary | ICD-10-CM | POA: Diagnosis not present

## 2018-05-26 DIAGNOSIS — I890 Lymphedema, not elsewhere classified: Secondary | ICD-10-CM | POA: Diagnosis not present

## 2018-05-26 DIAGNOSIS — M7701 Medial epicondylitis, right elbow: Secondary | ICD-10-CM | POA: Diagnosis not present

## 2018-05-29 DIAGNOSIS — M7701 Medial epicondylitis, right elbow: Secondary | ICD-10-CM | POA: Diagnosis not present

## 2018-05-29 DIAGNOSIS — I890 Lymphedema, not elsewhere classified: Secondary | ICD-10-CM | POA: Diagnosis not present

## 2018-05-29 DIAGNOSIS — M25311 Other instability, right shoulder: Secondary | ICD-10-CM | POA: Diagnosis not present

## 2018-06-02 DIAGNOSIS — M7701 Medial epicondylitis, right elbow: Secondary | ICD-10-CM | POA: Diagnosis not present

## 2018-06-02 DIAGNOSIS — M25311 Other instability, right shoulder: Secondary | ICD-10-CM | POA: Diagnosis not present

## 2018-06-02 DIAGNOSIS — I890 Lymphedema, not elsewhere classified: Secondary | ICD-10-CM | POA: Diagnosis not present

## 2018-06-05 DIAGNOSIS — M25311 Other instability, right shoulder: Secondary | ICD-10-CM | POA: Diagnosis not present

## 2018-06-05 DIAGNOSIS — I890 Lymphedema, not elsewhere classified: Secondary | ICD-10-CM | POA: Diagnosis not present

## 2018-06-05 DIAGNOSIS — M7701 Medial epicondylitis, right elbow: Secondary | ICD-10-CM | POA: Diagnosis not present

## 2018-06-09 DIAGNOSIS — M25311 Other instability, right shoulder: Secondary | ICD-10-CM | POA: Diagnosis not present

## 2018-06-09 DIAGNOSIS — I890 Lymphedema, not elsewhere classified: Secondary | ICD-10-CM | POA: Diagnosis not present

## 2018-06-09 DIAGNOSIS — M7701 Medial epicondylitis, right elbow: Secondary | ICD-10-CM | POA: Diagnosis not present

## 2018-08-04 DIAGNOSIS — I10 Essential (primary) hypertension: Secondary | ICD-10-CM | POA: Diagnosis not present

## 2018-08-20 DIAGNOSIS — R079 Chest pain, unspecified: Secondary | ICD-10-CM | POA: Diagnosis not present

## 2018-10-23 DIAGNOSIS — R05 Cough: Secondary | ICD-10-CM | POA: Diagnosis not present

## 2018-11-21 IMAGING — CT CT ABD-PELV W/ CM
2 of 5 series · 16 of 46 positions shown, 18 images · IV contrast (APPLIED)
Comparison: Abdominal ultrasound dated March 24, 2017. CT abdomen
and pelvis dated March 18, 2014.

CLINICAL DATA: Intermittent upper abdominal pain for the past 1-2
weeks with nausea and vomiting.

EXAM:
CT ABDOMEN AND PELVIS WITH CONTRAST
TECHNIQUE: Multidetector CT imaging of the abdomen and pelvis was performed
using the standard protocol following bolus administration of
intravenous contrast.
CONTRAST:  100mL [id number] IOPAMIDOL ([id number]) INJECTION 61%

[Series 2: axial st · axial · 0.98mm/px · z∈[-519,-24]mm · 13 of 111 slices shown, 15 images]
[im 6/111  soft-tissue]
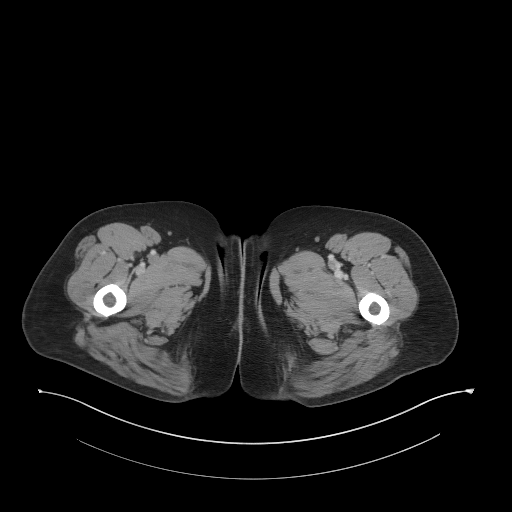
[im 6/111  bone]
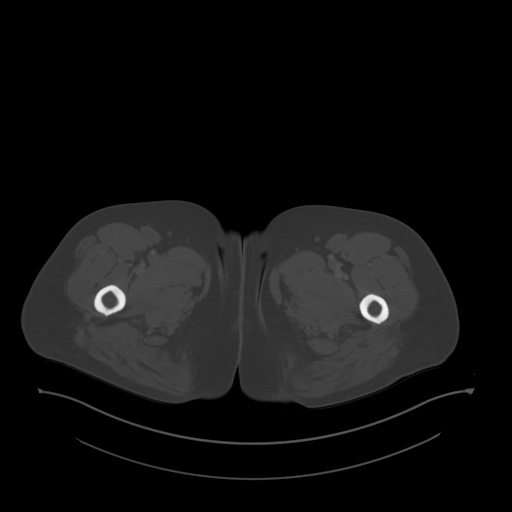
[im 18/111  soft-tissue]
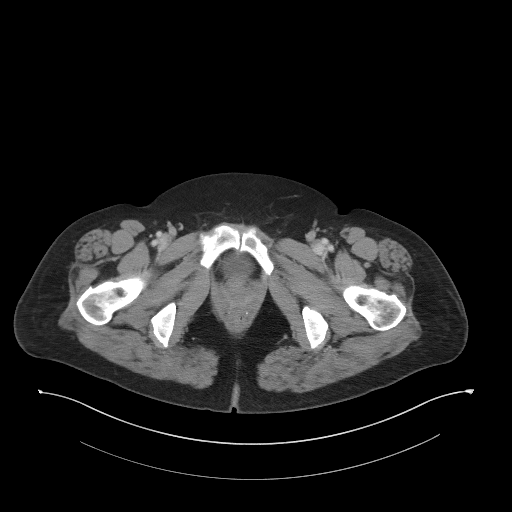
[im 24/111  soft-tissue]
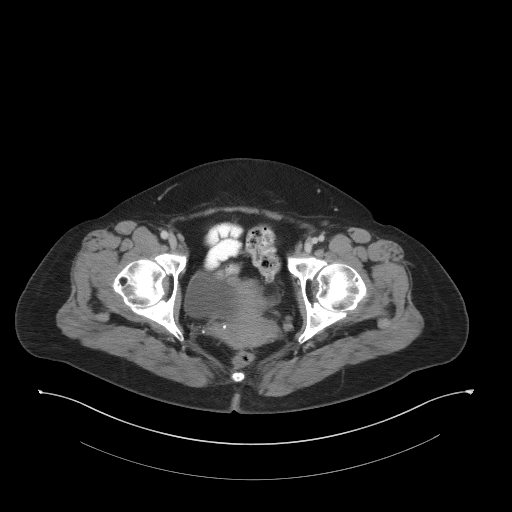
[im 29/111  soft-tissue]
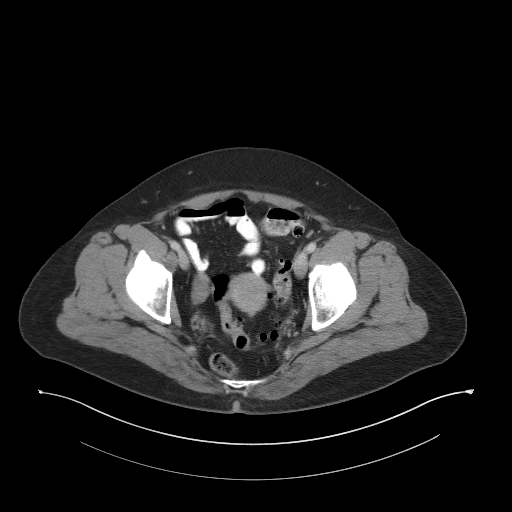
[im 41/111  soft-tissue]
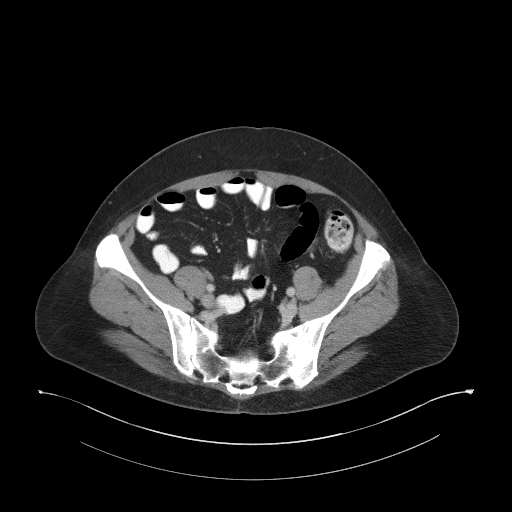
[im 47/111  soft-tissue]
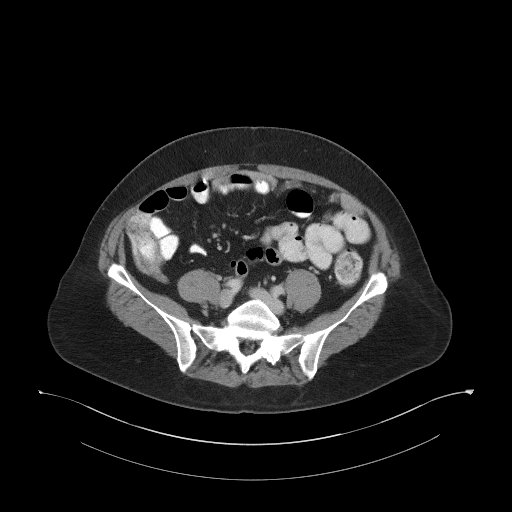
[im 58/111  soft-tissue]
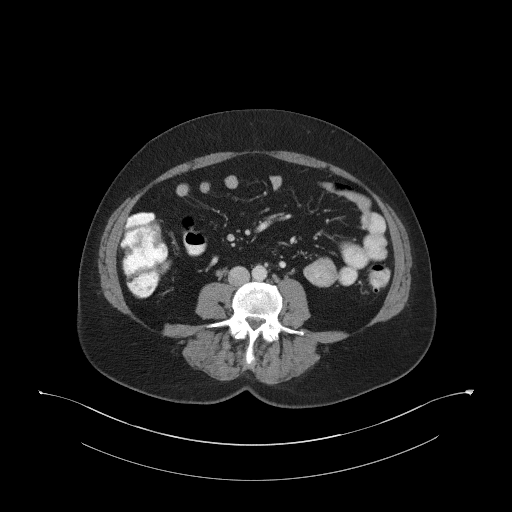
[im 64/111  soft-tissue]
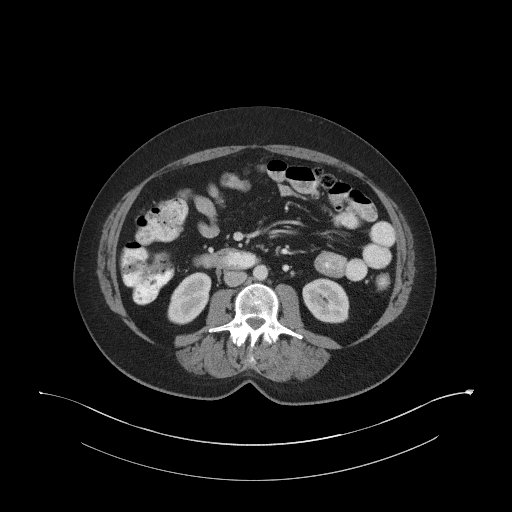
[im 70/111  soft-tissue]
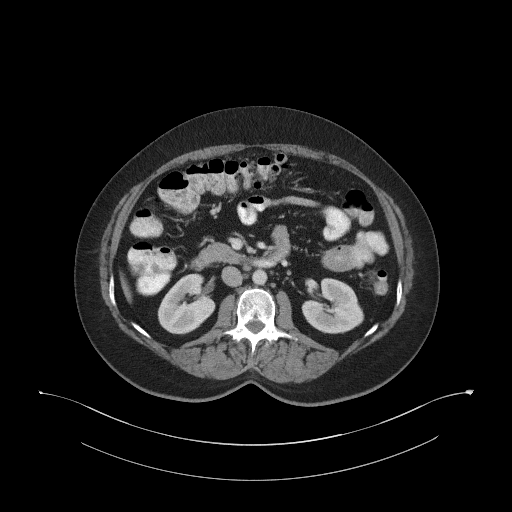
[im 70/111  bone]
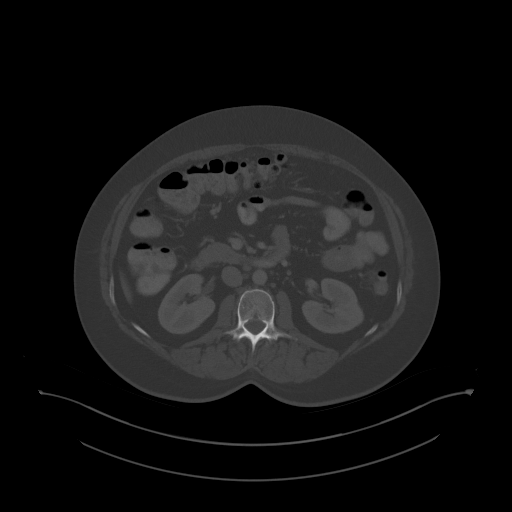
[im 82/111  soft-tissue]
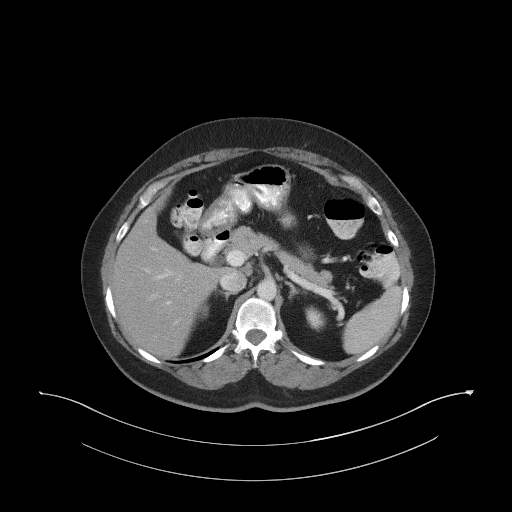
[im 87/111  soft-tissue]
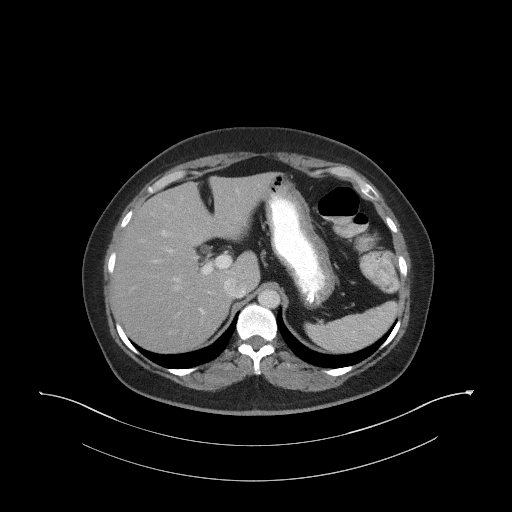
[im 93/111  soft-tissue]
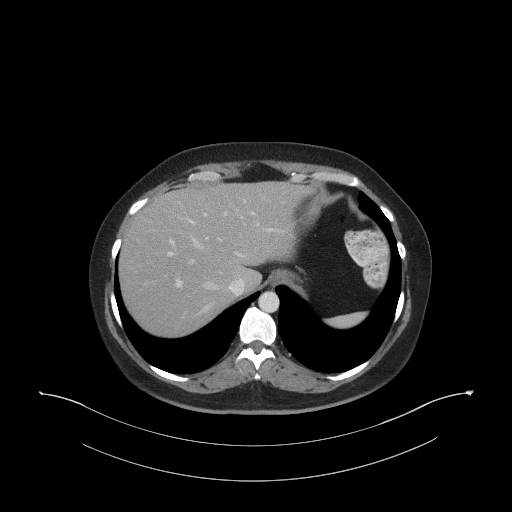
[im 105/111  soft-tissue]
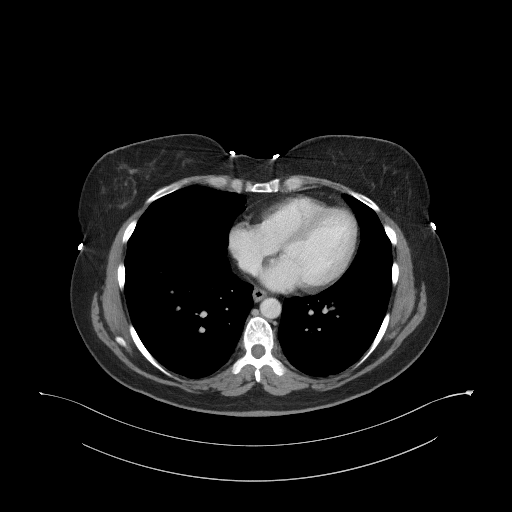

[Series 5: coronal st · coronal · 0.88mm/px · 3 of 102 slices shown]
[im 34/102  soft-tissue]
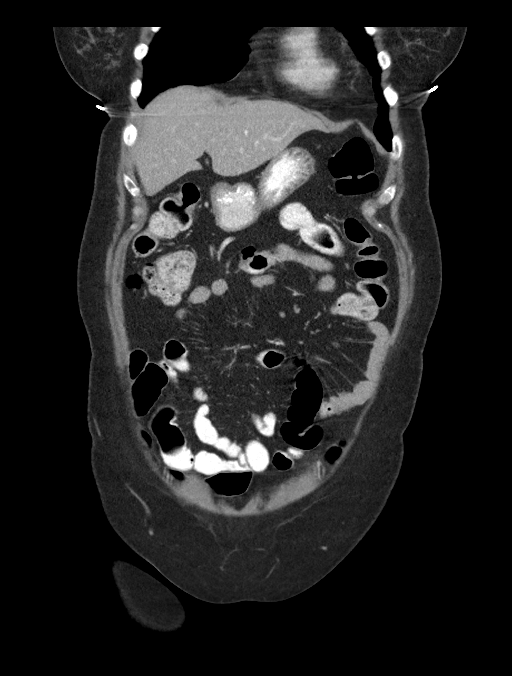
[im 45/102  soft-tissue]
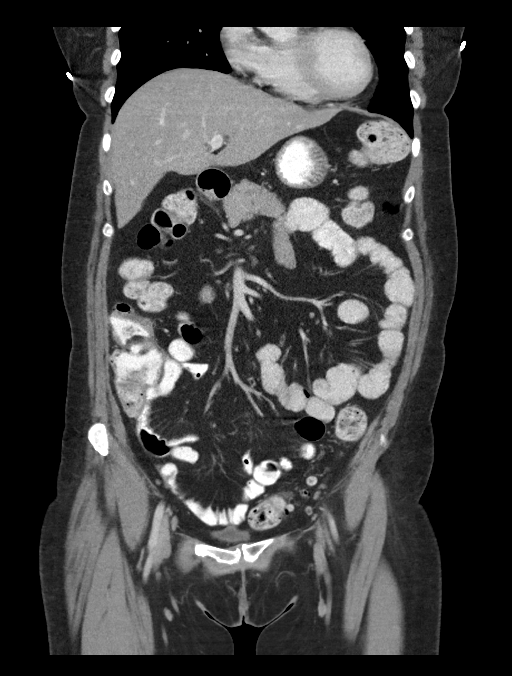
[im 57/102  soft-tissue]
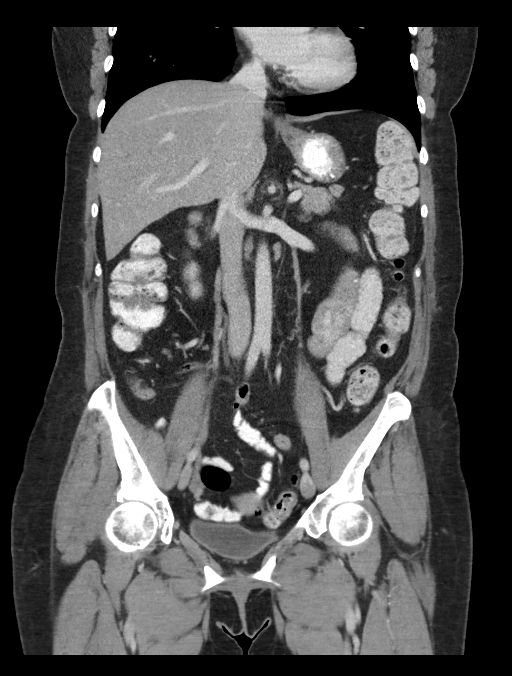

[16 of 46 positions shown; findings below may reference images not displayed]

FINDINGS: Lower chest: No acute abnormality.

Hepatobiliary: Hepatic steatosis. No focal liver abnormality. Prior
cholecystectomy. No biliary dilatation.

Pancreas: Unremarkable. No pancreatic ductal dilatation or
surrounding inflammatory changes.

Spleen: Normal in size without focal abnormality.

Adrenals/Urinary Tract: The adrenal glands are unremarkable.
Subcentimeter low-density lesion in the left kidney remains too
small to characterize, but is stable. Punctate calculus in the lower
pole the left kidney, unchanged. No hydronephrosis. The bladder is
unremarkable.

Stomach/Bowel: Stomach is within normal limits. Appendix appears
normal. No evidence of bowel wall thickening, distention, or
inflammatory changes. Mild left-sided colonic diverticulosis.

Vascular/Lymphatic: No significant vascular findings are present. No
enlarged abdominal or pelvic lymph nodes.

Reproductive: Uterus and bilateral adnexa are unremarkable.

Other: No abdominal wall hernia or abnormality. No abdominopelvic
ascites. No pneumoperitoneum.

Musculoskeletal: No acute or significant osseous findings.
IMPRESSION: 1.  No acute intra-abdominal process.
2. Unchanged punctate left nephrolithiasis.
3. Hepatic steatosis.

## 2018-12-16 DIAGNOSIS — R1031 Right lower quadrant pain: Secondary | ICD-10-CM | POA: Diagnosis not present

## 2019-02-03 DIAGNOSIS — I10 Essential (primary) hypertension: Secondary | ICD-10-CM | POA: Diagnosis not present

## 2019-02-03 DIAGNOSIS — Z0000 Encounter for general adult medical examination without abnormal findings: Secondary | ICD-10-CM | POA: Diagnosis not present

## 2019-04-12 DIAGNOSIS — Z1231 Encounter for screening mammogram for malignant neoplasm of breast: Secondary | ICD-10-CM | POA: Diagnosis not present

## 2019-08-06 IMAGING — US US ABDOMEN COMPLETE
1 series · 14 of 25 positions shown · non-contrast
Comparison: None.

CLINICAL DATA: Right quadrant pain.

EXAM:
ABDOMEN ULTRASOUND COMPLETE

[Series 1: us abdomen complete · 0.25mm/px · 14 of 78 slices shown]
[im 1/78]
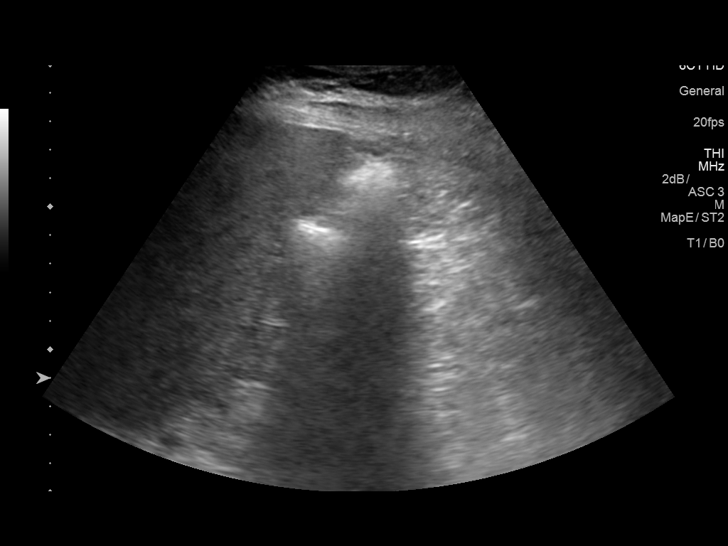
[im 7/78]
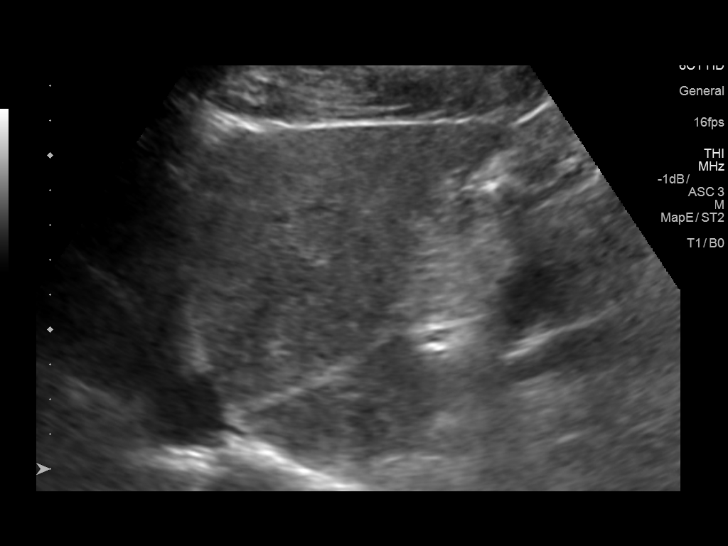
[im 13/78]
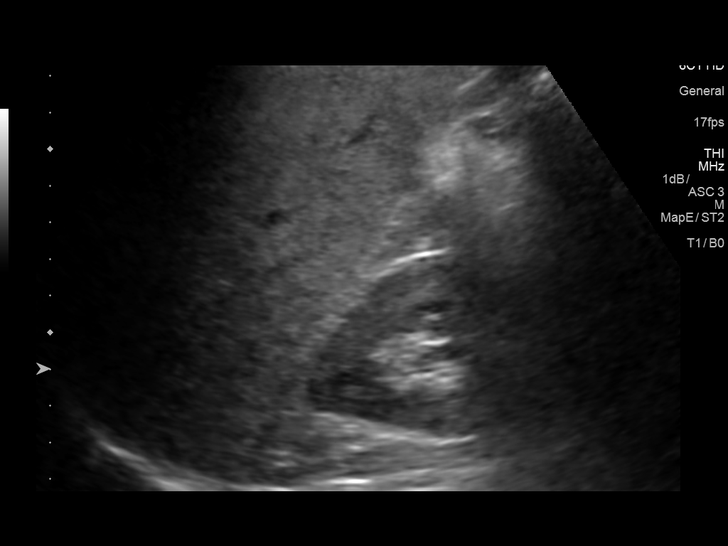
[im 20/78]
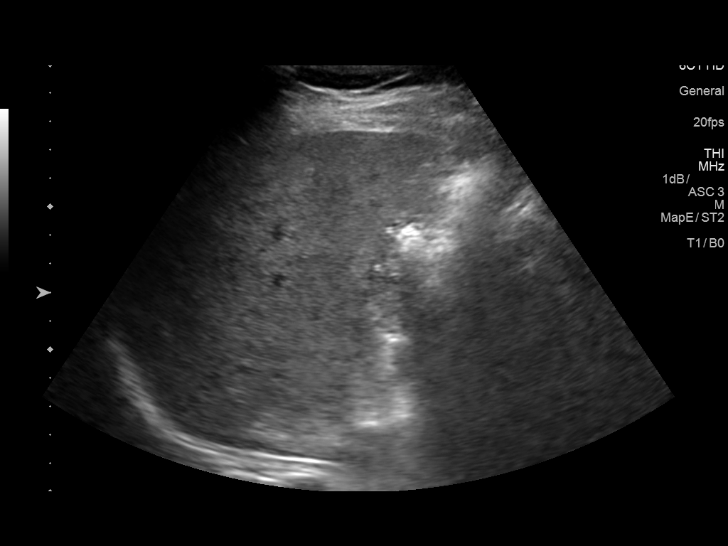
[im 26/78]
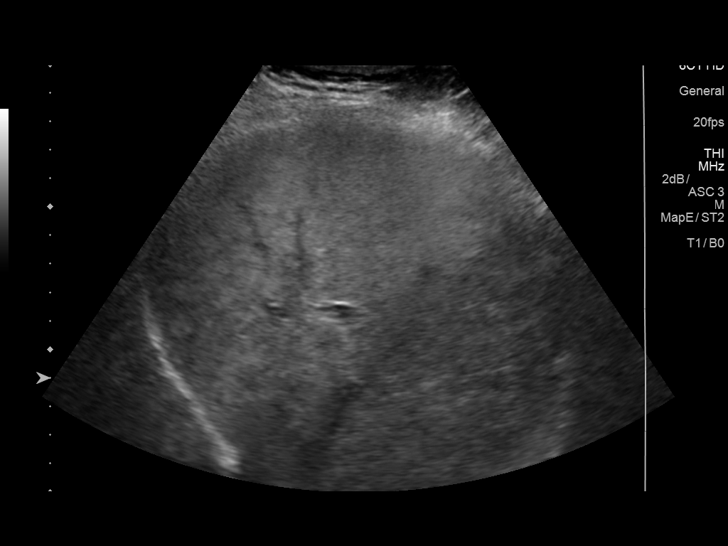
[im 29/78]
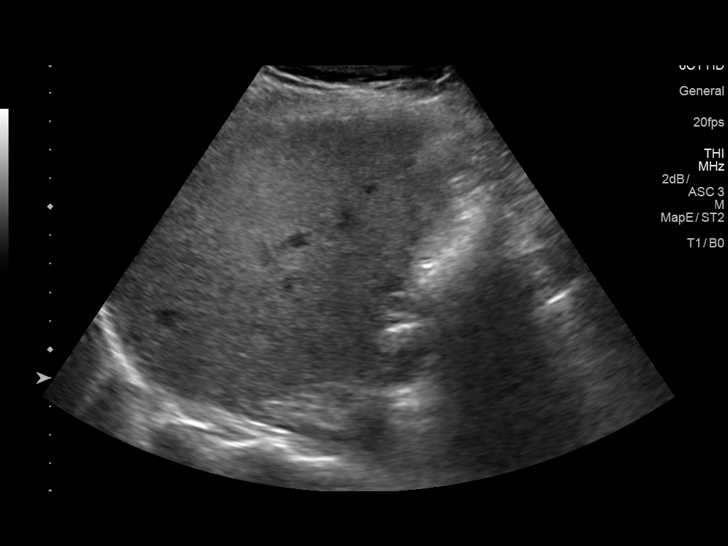
[im 36/78]
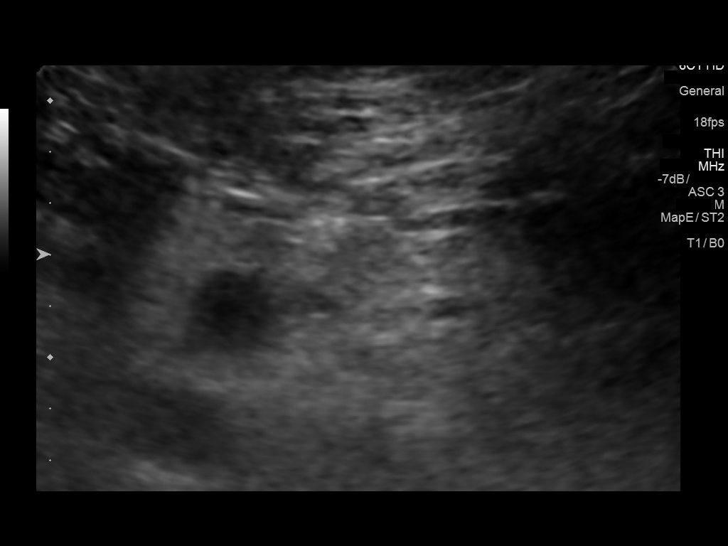
[im 42/78]
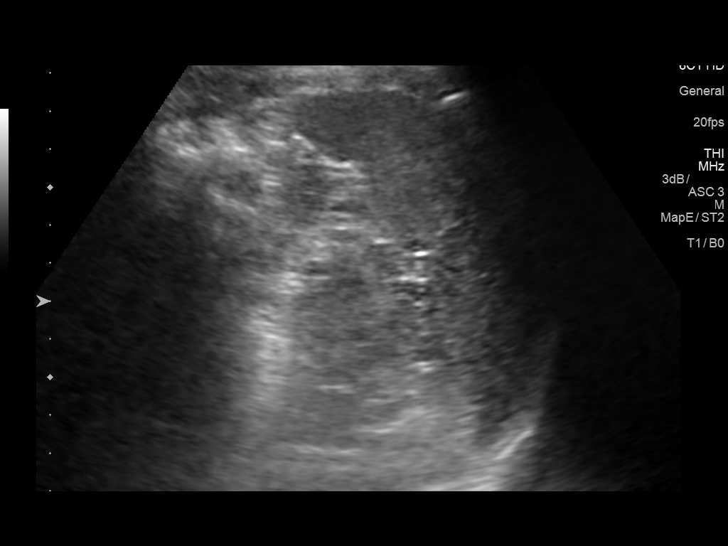
[im 49/78]
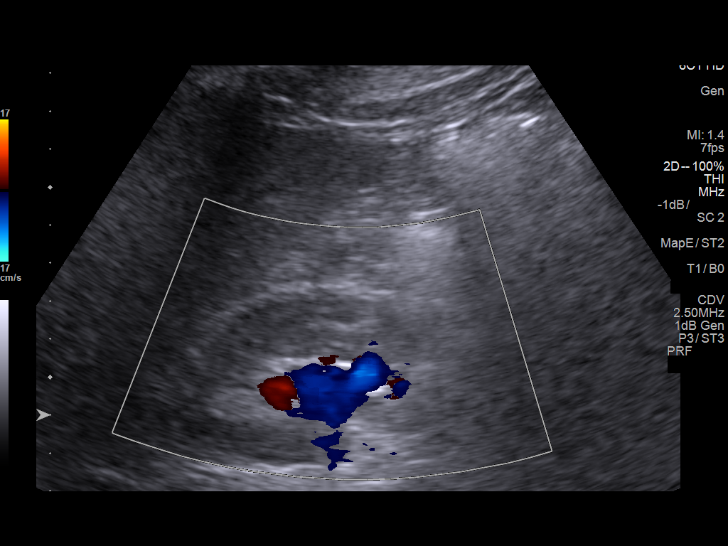
[im 52/78]
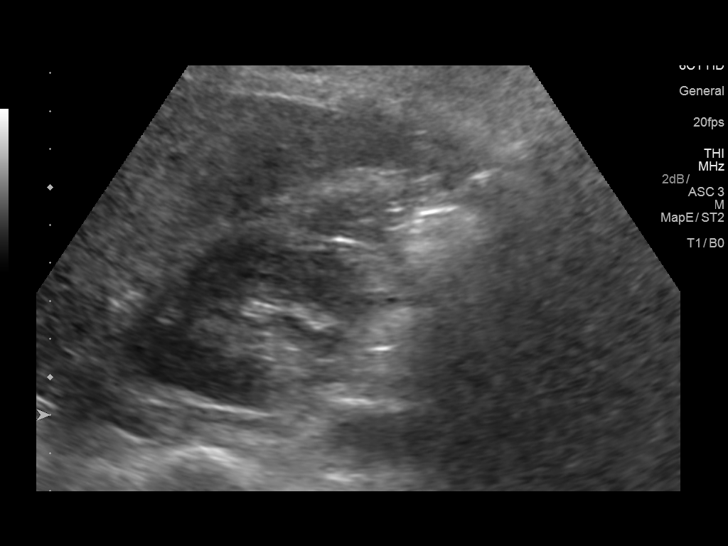
[im 58/78]
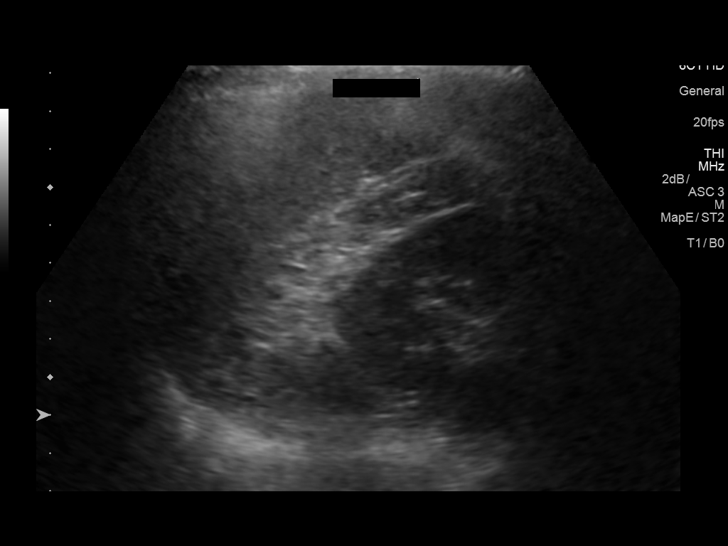
[im 65/78]
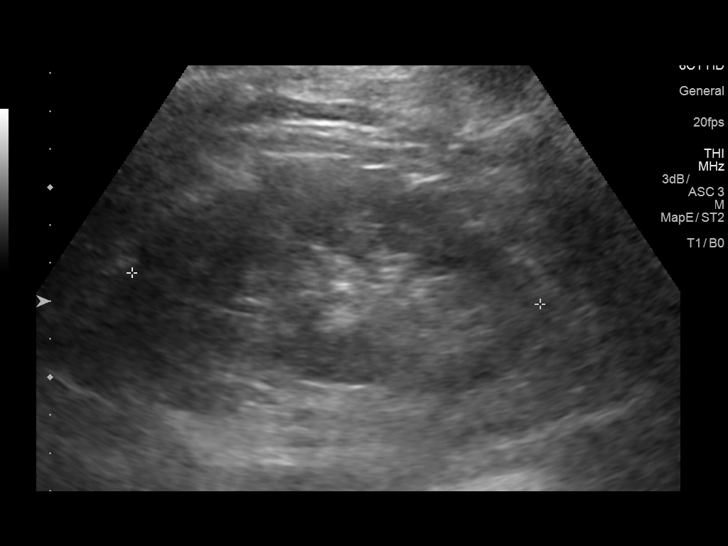
[im 71/78]
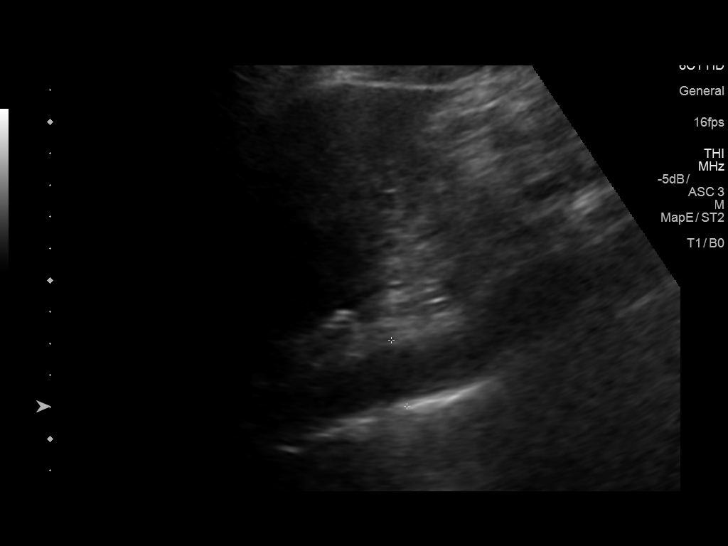
[im 78/78]
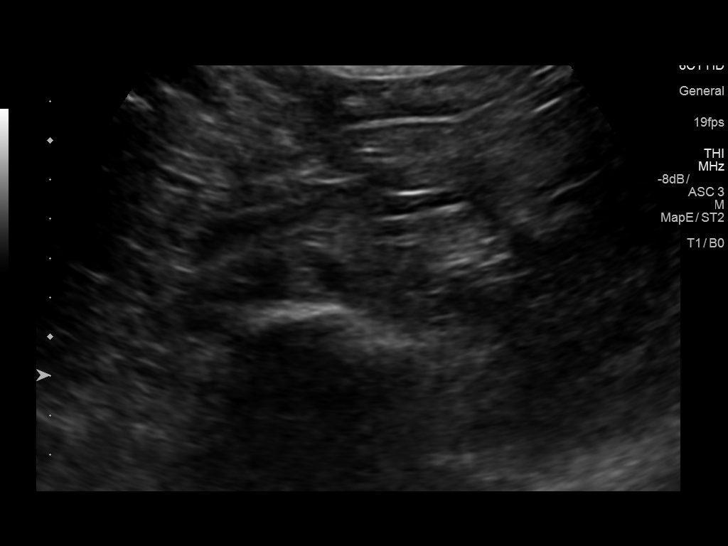

[14 of 25 positions shown; findings below may reference images not displayed]

FINDINGS: Gallbladder: Cholecystectomy.

Common bile duct: Diameter: 5.8 mm

Liver: No focal lesion identified. Within normal limits in
parenchymal echogenicity. Portal vein is patent on color Doppler
imaging with normal direction of blood flow towards the liver.

IVC: No abnormality visualized.

Pancreas: Visualized portion unremarkable.

Spleen: Size and appearance within normal limits.

Right Kidney: Length: 9.4 cm. Echogenicity within normal limits. No
mass or hydronephrosis visualized.

Left Kidney: Length: 10.8 cm. Echogenicity within normal limits. No
mass or hydronephrosis visualized.

Abdominal aorta: No aneurysm visualized.

Other findings: None.
IMPRESSION: 1. Cholecystectomy.  No biliary distention.

2. Exam is otherwise unremarkable.

## 2019-08-29 DIAGNOSIS — Z20828 Contact with and (suspected) exposure to other viral communicable diseases: Secondary | ICD-10-CM | POA: Diagnosis not present

## 2019-09-02 DIAGNOSIS — Z20828 Contact with and (suspected) exposure to other viral communicable diseases: Secondary | ICD-10-CM | POA: Diagnosis not present

## 2019-09-12 DIAGNOSIS — J029 Acute pharyngitis, unspecified: Secondary | ICD-10-CM | POA: Diagnosis not present

## 2019-12-01 DIAGNOSIS — T07XXXA Unspecified multiple injuries, initial encounter: Secondary | ICD-10-CM | POA: Diagnosis not present

## 2019-12-15 DIAGNOSIS — M545 Low back pain: Secondary | ICD-10-CM | POA: Diagnosis not present

## 2020-03-17 IMAGING — US US EXTREM  UP VENOUS*R*
1 series · 13 of 24 positions shown · non-contrast
Comparison: None.

CLINICAL DATA: 52-year-old with right arm swelling from the axilla
to the elbow.



[Series 1: us extrem up venous*right* · 13 of 37 slices shown]
[im 1/37]
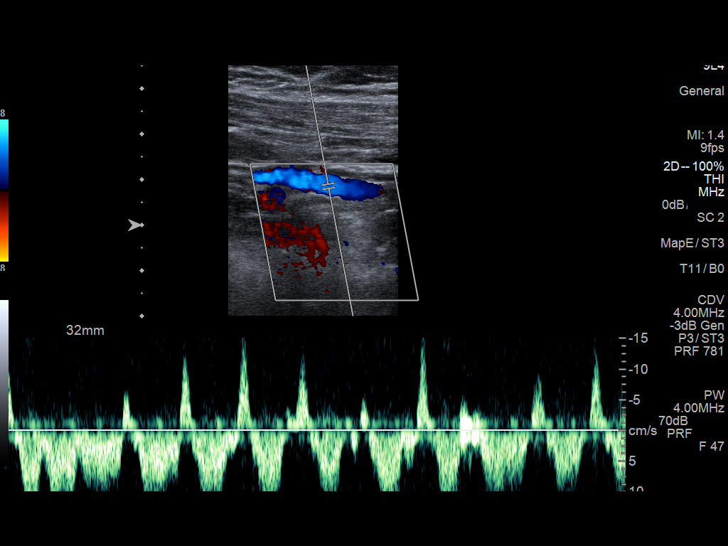
[im 4/37]
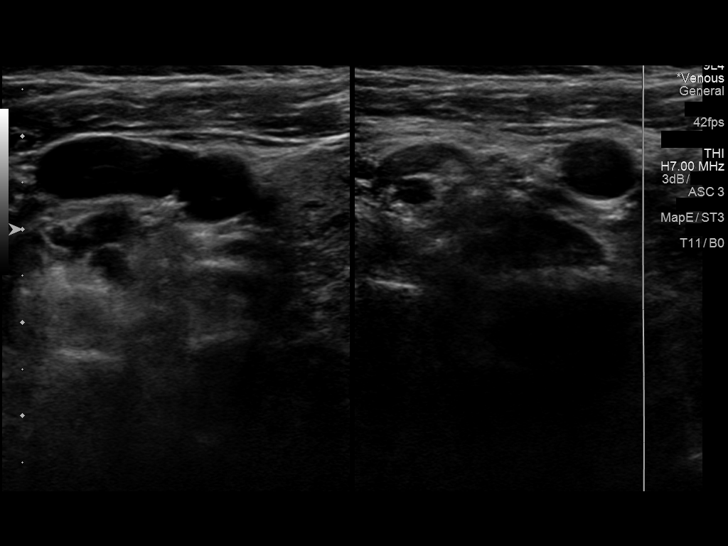
[im 7/37]
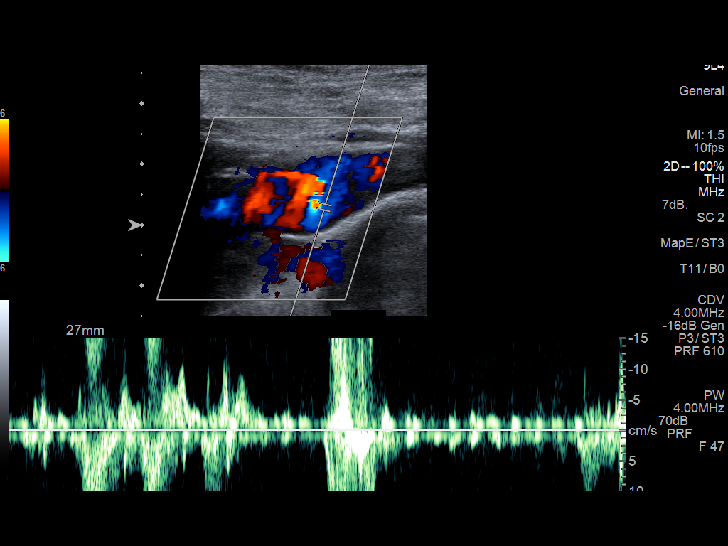
[im 10/37]
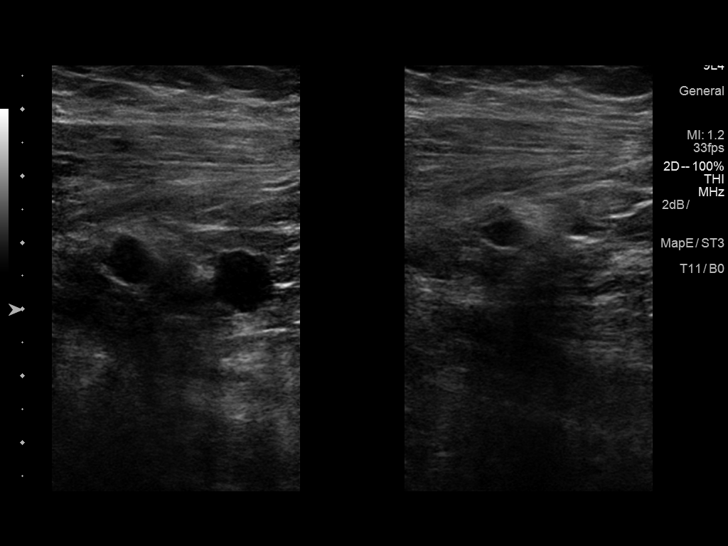
[im 13/37]
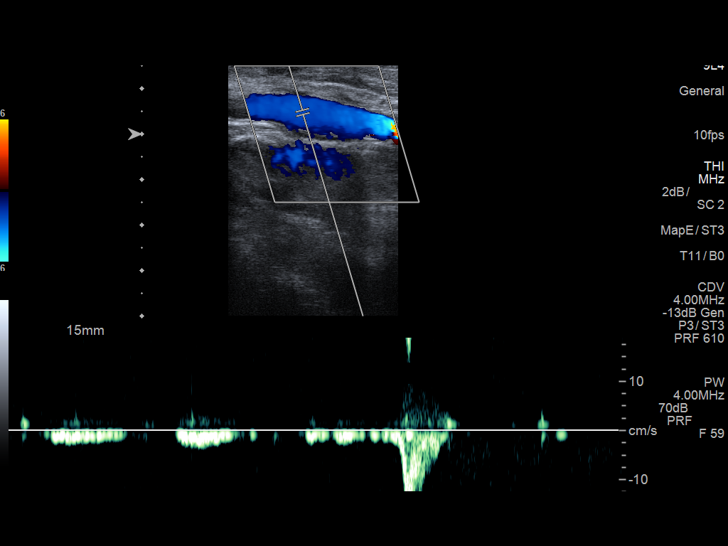
[im 16/37]
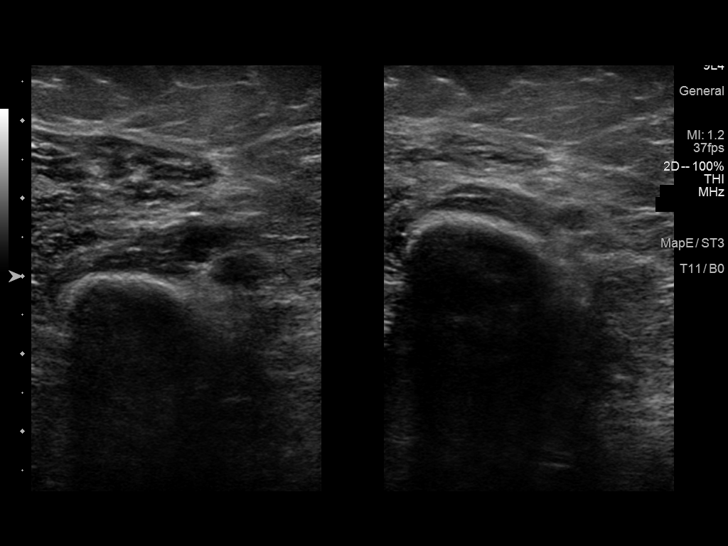
[im 19/37]
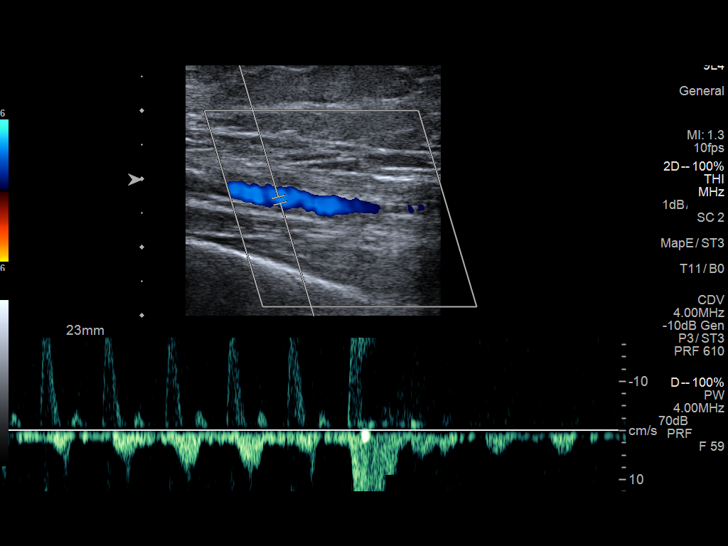
[im 21/37]
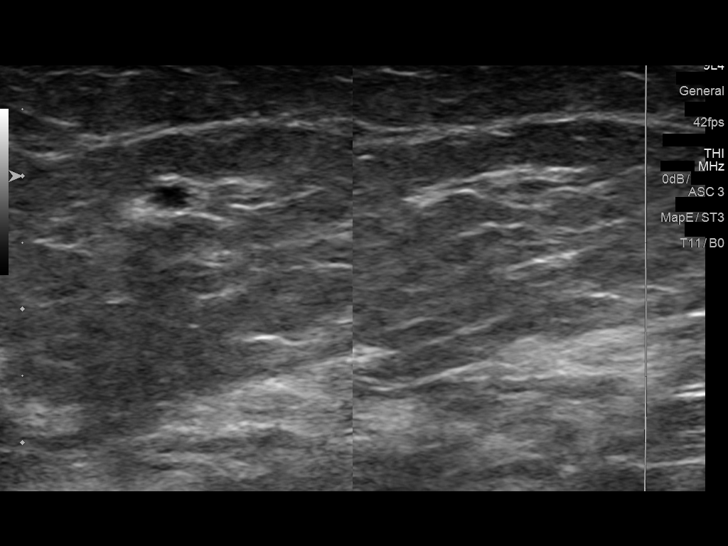
[im 24/37]
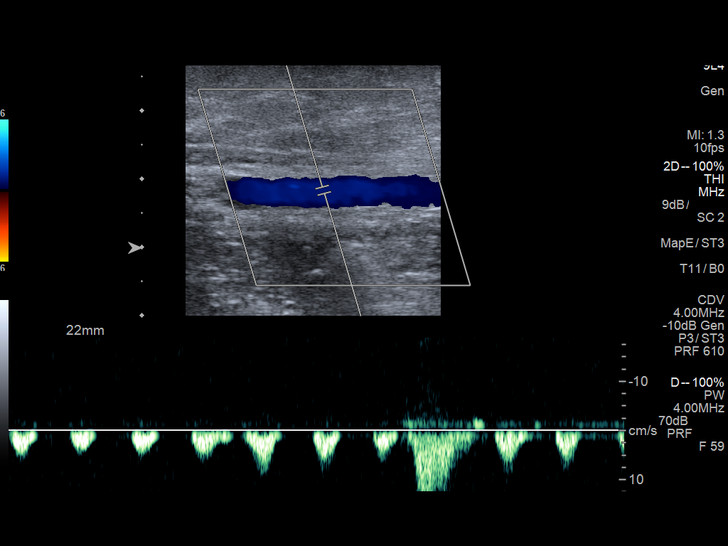
[im 27/37]
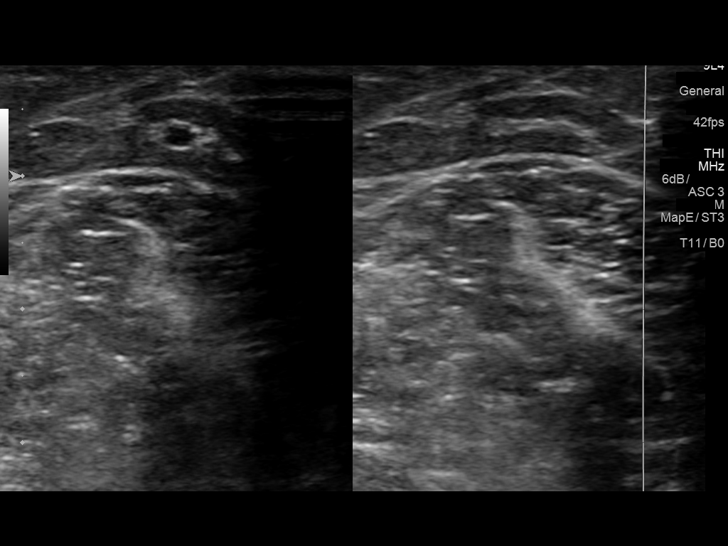
[im 30/37]
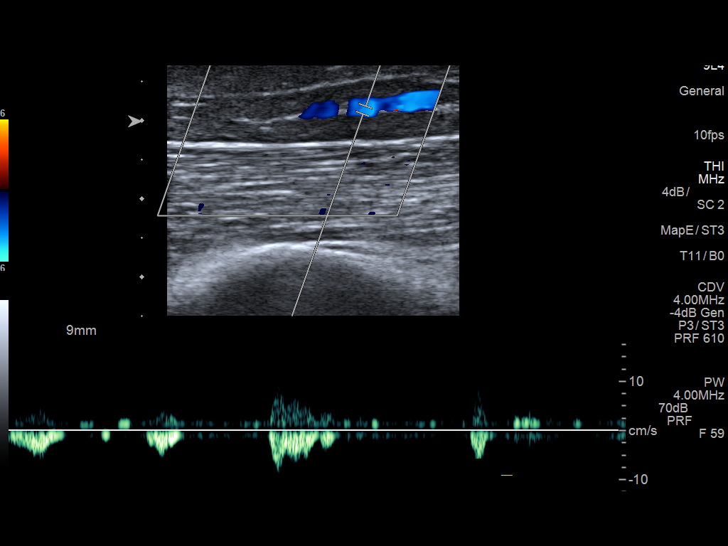
[im 33/37]
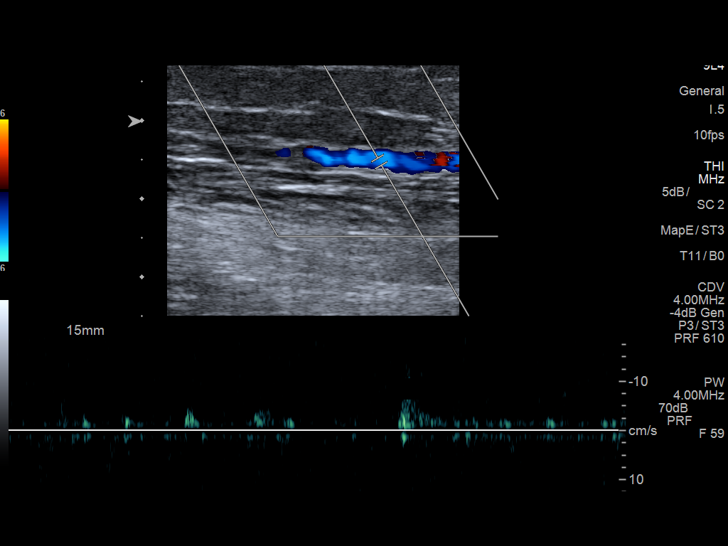
[im 37/37]
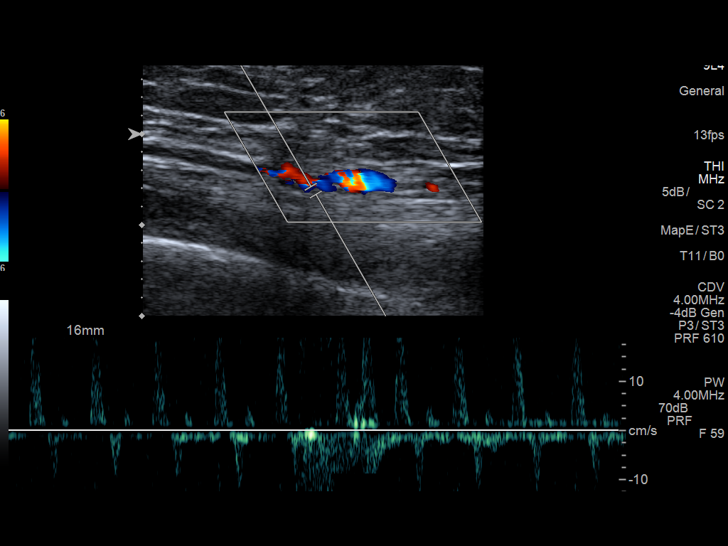

[13 of 24 positions shown; findings below may reference images not displayed]

FINDINGS: Contralateral Subclavian Vein: Respiratory phasicity is normal and
symmetric with the symptomatic side. No evidence of thrombus. Normal
compressibility.

Internal Jugular Vein: No evidence of thrombus. Normal
compressibility, respiratory phasicity and response to augmentation.

Subclavian Vein: No evidence of thrombus. Normal compressibility,
respiratory phasicity and response to augmentation.

Axillary Vein: No evidence of thrombus. Normal compressibility,
respiratory phasicity and response to augmentation.

Cephalic Vein: No evidence of thrombus. Normal compressibility,
respiratory phasicity and response to augmentation.

Basilic Vein: No evidence of thrombus. Normal compressibility,
respiratory phasicity and response to augmentation.

Brachial Veins: No evidence of thrombus. Normal compressibility,
respiratory phasicity and response to augmentation.

Radial Veins: No evidence of thrombus. Normal compressibility,
respiratory phasicity and response to augmentation.

Ulnar Veins: No evidence of thrombus. Normal compressibility,
respiratory phasicity and response to augmentation.

Other Findings:  None visualized.
IMPRESSION: No evidence of DVT within the right upper extremity.

## 2022-02-23 ENCOUNTER — Encounter (HOSPITAL_COMMUNITY): Payer: Self-pay | Admitting: Emergency Medicine

## 2022-02-23 ENCOUNTER — Emergency Department (HOSPITAL_COMMUNITY): Payer: BC Managed Care – PPO

## 2022-02-23 ENCOUNTER — Other Ambulatory Visit: Payer: Self-pay

## 2022-02-23 ENCOUNTER — Inpatient Hospital Stay (HOSPITAL_COMMUNITY)
Admission: EM | Admit: 2022-02-23 | Discharge: 2022-02-27 | DRG: 392 | Disposition: A | Discharge status: Home / Self Care | Payer: BC Managed Care – PPO | Attending: Internal Medicine | Admitting: Internal Medicine

## 2022-02-23 DIAGNOSIS — K5732 Diverticulitis of large intestine without perforation or abscess without bleeding: Principal | ICD-10-CM | POA: Diagnosis present

## 2022-02-23 DIAGNOSIS — R110 Nausea: Secondary | ICD-10-CM | POA: Diagnosis present

## 2022-02-23 DIAGNOSIS — Z9079 Acquired absence of other genital organ(s): Secondary | ICD-10-CM

## 2022-02-23 DIAGNOSIS — Z881 Allergy status to other antibiotic agents status: Secondary | ICD-10-CM

## 2022-02-23 DIAGNOSIS — N281 Cyst of kidney, acquired: Secondary | ICD-10-CM | POA: Diagnosis present

## 2022-02-23 DIAGNOSIS — Z888 Allergy status to other drugs, medicaments and biological substances status: Secondary | ICD-10-CM

## 2022-02-23 DIAGNOSIS — I493 Ventricular premature depolarization: Secondary | ICD-10-CM | POA: Diagnosis not present

## 2022-02-23 DIAGNOSIS — I10 Essential (primary) hypertension: Secondary | ICD-10-CM | POA: Diagnosis present

## 2022-02-23 DIAGNOSIS — Z79899 Other long term (current) drug therapy: Secondary | ICD-10-CM

## 2022-02-23 DIAGNOSIS — G2581 Restless legs syndrome: Secondary | ICD-10-CM | POA: Diagnosis present

## 2022-02-23 DIAGNOSIS — K219 Gastro-esophageal reflux disease without esophagitis: Secondary | ICD-10-CM | POA: Diagnosis present

## 2022-02-23 DIAGNOSIS — Z882 Allergy status to sulfonamides status: Secondary | ICD-10-CM

## 2022-02-23 DIAGNOSIS — K5792 Diverticulitis of intestine, part unspecified, without perforation or abscess without bleeding: Secondary | ICD-10-CM | POA: Diagnosis not present

## 2022-02-23 DIAGNOSIS — K5900 Constipation, unspecified: Secondary | ICD-10-CM | POA: Diagnosis present

## 2022-02-23 DIAGNOSIS — Z9049 Acquired absence of other specified parts of digestive tract: Secondary | ICD-10-CM

## 2022-02-23 DIAGNOSIS — Z91048 Other nonmedicinal substance allergy status: Secondary | ICD-10-CM

## 2022-02-23 HISTORY — DX: Diverticulitis of intestine, part unspecified, without perforation or abscess without bleeding: K57.92

## 2022-02-23 HISTORY — DX: Migraine, unspecified, not intractable, without status migrainosus: G43.909

## 2022-02-23 LAB — COMPREHENSIVE METABOLIC PANEL
ALT: 24 U/L (ref 0–44)
AST: 21 U/L (ref 15–41)
Albumin: 4 g/dL (ref 3.5–5.0)
Alkaline Phosphatase: 66 U/L (ref 38–126)
Anion gap: 10 (ref 5–15)
BUN: 8 mg/dL (ref 6–20)
CO2: 24 mmol/L (ref 22–32)
Calcium: 9.1 mg/dL (ref 8.9–10.3)
Chloride: 104 mmol/L (ref 98–111)
Creatinine, Ser: 0.85 mg/dL (ref 0.44–1.00)
GFR, Estimated: >60 mL/min (ref >60)
Glucose, Bld: 149 mg/dL — ABNORMAL HIGH (ref 70–99)
Potassium: 3.9 mmol/L (ref 3.5–5.1)
Sodium: 138 mmol/L (ref 135–145)
Total Bilirubin: 0.9 mg/dL (ref 0.3–1.2)
Total Protein: 6.9 g/dL (ref 6.5–8.1)

## 2022-02-23 LAB — CBC
HCT: 36.9 % (ref 36.0–46.0)
Hemoglobin: 12.4 g/dL (ref 12.0–15.0)
MCH: 31.6 pg (ref 26.0–34.0)
MCHC: 33.6 g/dL (ref 30.0–36.0)
MCV: 93.9 fL (ref 80.0–100.0)
Platelets: 262 10*3/uL (ref 150–400)
RBC: 3.93 MIL/uL (ref 3.87–5.11)
RDW: 12.2 % (ref 11.5–15.5)
WBC: 13.5 10*3/uL — ABNORMAL HIGH (ref 4.0–10.5)
nRBC: 0 % (ref 0.0–0.2)

## 2022-02-23 LAB — I-STAT BETA HCG BLOOD, ED (MC, WL, AP ONLY): I-stat hCG, quantitative: <5 m[IU]/mL (ref <5)

## 2022-02-23 LAB — URINALYSIS, ROUTINE W REFLEX MICROSCOPIC
Bilirubin Urine: NEGATIVE
Glucose, UA: NEGATIVE mg/dL
Hgb urine dipstick: NEGATIVE
Ketones, ur: 5 mg/dL — AB
Leukocytes,Ua: NEGATIVE
Nitrite: NEGATIVE
Protein, ur: NEGATIVE mg/dL
Specific Gravity, Urine: 1.012 (ref 1.005–1.030)
pH: 6 (ref 5.0–8.0)

## 2022-02-23 LAB — HIV ANTIBODY (ROUTINE TESTING W REFLEX): HIV Screen 4th Generation wRfx: NONREACTIVE

## 2022-02-23 LAB — LIPASE, BLOOD: Lipase: 30 U/L (ref 11–51)

## 2022-02-23 LAB — LACTIC ACID, PLASMA: Lactic Acid, Venous: 1.3 mmol/L (ref 0.5–1.9)

## 2022-02-23 MED ORDER — ACETAMINOPHEN 650 MG RE SUPP: 650.0000 mg | Freq: Four times a day (QID) | RECTAL | Status: DC | PRN

## 2022-02-23 MED ORDER — ACETAMINOPHEN 325 MG PO TABS
650.0000 mg | ORAL_TABLET | Freq: Once | ORAL | Status: AC
  Administered 2022-02-23: 650 mg via ORAL
  Filled 2022-02-23: qty 2

## 2022-02-23 MED ORDER — SODIUM CHLORIDE 0.9 % IV BOLUS
1000.0000 mL | Freq: Once | INTRAVENOUS | Status: AC
  Administered 2022-02-23: 1000 mL via INTRAVENOUS

## 2022-02-23 MED ORDER — PIPERACILLIN-TAZOBACTAM 3.375 G IVPB
3.3750 g | Freq: Three times a day (TID) | INTRAVENOUS | Status: DC
  Administered 2022-02-23 – 2022-02-25 (×6): 3.375 g via INTRAVENOUS
  Filled 2022-02-23 (×6): qty 50

## 2022-02-23 MED ORDER — LACTATED RINGERS IV SOLN: INTRAVENOUS | Status: DC

## 2022-02-23 MED ORDER — ONDANSETRON HCL 4 MG/2ML IJ SOLN
4.0000 mg | Freq: Once | INTRAMUSCULAR | Status: AC
  Administered 2022-02-23: 4 mg via INTRAVENOUS
  Filled 2022-02-23: qty 2

## 2022-02-23 MED ORDER — ACEBUTOLOL HCL 200 MG PO CAPS
200.0000 mg | ORAL_CAPSULE | Freq: Every morning | ORAL | Status: DC
  Administered 2022-02-23: 200 mg via ORAL
  Filled 2022-02-23 (×2): qty 1

## 2022-02-23 MED ORDER — OXYCODONE HCL 5 MG PO TABS: 5.0000 mg | ORAL_TABLET | ORAL | Status: DC | PRN

## 2022-02-23 MED ORDER — PIPERACILLIN-TAZOBACTAM 3.375 G IVPB 30 MIN
3.3750 g | Freq: Once | INTRAVENOUS | Status: AC
  Administered 2022-02-23: 3.375 g via INTRAVENOUS
  Filled 2022-02-23: qty 50

## 2022-02-23 MED ORDER — FLUTICASONE PROPIONATE 50 MCG/ACT NA SUSP
1.0000 | Freq: Every day | NASAL | Status: DC
Start: 2022-02-23 — End: 2022-02-27
  Administered 2022-02-24 – 2022-02-27 (×4): 1 via NASAL
  Filled 2022-02-23: qty 16

## 2022-02-23 MED ORDER — ALBUTEROL SULFATE (2.5 MG/3ML) 0.083% IN NEBU
2.5000 mg | INHALATION_SOLUTION | Freq: Four times a day (QID) | RESPIRATORY_TRACT | Status: DC | PRN
Start: 2022-02-23 — End: 2022-02-27

## 2022-02-23 MED ORDER — IPRATROPIUM BROMIDE 0.06 % NA SOLN
1.0000 | Freq: Every day | NASAL | Status: DC
  Administered 2022-02-24 – 2022-02-27 (×4): 1 via NASAL
  Filled 2022-02-23: qty 15

## 2022-02-23 MED ORDER — LISINOPRIL 10 MG PO TABS
10.0000 mg | ORAL_TABLET | Freq: Every day | ORAL | Status: DC
  Administered 2022-02-23: 10 mg via ORAL
  Filled 2022-02-23: qty 1

## 2022-02-23 MED ORDER — ONDANSETRON 4 MG PO TBDP
4.0000 mg | ORAL_TABLET | Freq: Four times a day (QID) | ORAL | Status: DC | PRN
  Filled 2022-02-23: qty 1

## 2022-02-23 MED ORDER — ACETAMINOPHEN 325 MG PO TABS
650.0000 mg | ORAL_TABLET | Freq: Four times a day (QID) | ORAL | Status: DC | PRN
  Administered 2022-02-23 – 2022-02-25 (×5): 650 mg via ORAL
  Filled 2022-02-23 (×4): qty 2

## 2022-02-23 MED ORDER — FAMOTIDINE 20 MG PO TABS
20.0000 mg | ORAL_TABLET | Freq: Every day | ORAL | Status: DC
  Administered 2022-02-23 – 2022-02-27 (×5): 20 mg via ORAL
  Filled 2022-02-23 (×5): qty 1

## 2022-02-23 MED ORDER — IOHEXOL 300 MG/ML  SOLN
80.0000 mL | Freq: Once | INTRAMUSCULAR | Status: AC | PRN
  Administered 2022-02-23: 80 mL via INTRAVENOUS

## 2022-02-23 MED ORDER — HYDRALAZINE HCL 20 MG/ML IJ SOLN: 10.0000 mg | INTRAMUSCULAR | Status: DC | PRN

## 2022-02-23 MED ORDER — FENTANYL CITRATE PF 50 MCG/ML IJ SOSY
50.0000 ug | PREFILLED_SYRINGE | Freq: Once | INTRAMUSCULAR | Status: AC
  Administered 2022-02-23: 50 ug via INTRAVENOUS
  Filled 2022-02-23: qty 1

## 2022-02-23 MED ORDER — MORPHINE SULFATE (PF) 2 MG/ML IV SOLN
2.0000 mg | INTRAVENOUS | Status: DC | PRN
  Administered 2022-02-23: 2 mg via INTRAVENOUS
  Filled 2022-02-23: qty 1

## 2022-02-23 MED ORDER — ONDANSETRON HCL 4 MG/2ML IJ SOLN
4.0000 mg | Freq: Four times a day (QID) | INTRAMUSCULAR | Status: DC | PRN
  Administered 2022-02-24 – 2022-02-26 (×2): 4 mg via INTRAVENOUS
  Filled 2022-02-23 (×2): qty 2

## 2022-02-23 MED ORDER — ALBUTEROL SULFATE HFA 108 (90 BASE) MCG/ACT IN AERS: 2.0000 | INHALATION_SPRAY | Freq: Four times a day (QID) | RESPIRATORY_TRACT | Status: DC | PRN

## 2022-02-23 NOTE — Progress Notes (Signed)
[facility]link following for sepsis protocol. 

## 2022-02-23 NOTE — H&P (Signed)
History and Physical    PatientMarland Kitchen Carol Hamilton UVO:[id number] DOB: 03-15-1966 DOA: 02/23/2022 DOS: the patient was seen and examined on 02/23/2022 PCP: Orpah Melter, MD  Patient coming from: Home - lives with husband; NOK: Husband, [phone]   Chief Complaint: Abdominal pain  HPI: Carol Hamilton is a 56 y.o. female with medical history significant of HTN and RLS presenting with abdominal pain.  She started having LLQ pain.  She stayed home from work yesterday and it worsened overnight and she started running a fever.  Temp 100.4.  She has had diverticulitis before and she was given Cipro/Flagyl and was unable to tolerate the flagyl.  +nausea, no vomiting.  She had food poisoning last week - n/v/d an hour after eating maybe a week prior to onset of current symptoms.  Her stool has bene transitioning since.  Yesterday, it was like a pencil when she passed any stool.  She is due for another c-scope with [facility] in September.    ER Course:  Diverticulitis with h/o the same.  No perf or abscess on CT.  Given Zofran, Fentanyl, Zosyn.     Review of Systems: As mentioned in the history of present illness. All other systems reviewed and are negative. Past Medical History:  Diagnosis Date   Adhesive capsulitis of left shoulder    Diverticulitis    GERD (gastroesophageal reflux disease)    Headache(784.0)    Migraines   History of endometriosis    left ovary--  s/p  lso  2012   Hypertension    Labral tear of shoulder    LEFT   Migraines    OA (osteoarthritis) of knee    RIGHT   PVC's (premature ventricular contractions)    RLS (restless legs syndrome)    Wears glasses    Past Surgical History:  Procedure Laterality Date   FOOT SURGERY  child   FBO-removed needle left foot   KNEE ARTHROSCOPY WITH ANTERIOR CRUCIATE LIGAMENT (ACL) REPAIR Right 2013   KNEE ARTHROSCOPY WITH MEDIAL MENISECTOMY Right 12/16/2013   Procedure: I & D Mass with excisional biopsy right knee;  Surgeon:  Sydnee Cabal, MD;  Location: [place];  Service: Orthopedics;  Laterality: Right;   LAPAROSCOPIC CHOLECYSTECTOMY  2008    [facility]   ROBOT ASSISTED LEFT SALPINGOOPHORECTOMY /  RIGHT SALPINGECTOMY  06-11-2011   SHOULDER ARTHROSCOPY W/ LABRAL REPAIR Right 2004   [name]   SHOULDER ARTHROSCOPY WITH SUBACROMIAL DECOMPRESSION, ROTATOR CUFF REPAIR AND BICEP TENDON REPAIR Left 02/03/2014   Procedure: LEFT SHOULDER ARTHROSCOPY WITH MANIPULATION UNDER ANESTHESIA/RELEASES SUBACROMIAL DECOMPRESSION/DISTAL CLAVICLE RESECTION/LABRAL DEBRIDEMENT.;  Surgeon: Sydnee Cabal, MD;  Location: [place];  Service: Orthopedics;  Laterality: Left;  ANESTHESIA:  GENERAL/INTERSCALENE BLOCK   Social History:  reports that she has never smoked. She has never used smokeless tobacco. She reports current alcohol use of about 1.0 standard drink of alcohol per week. She reports that she does not use drugs.  Allergies  Allergen Reactions   Amoxil [Amoxicillin] Nausea And Vomiting   Augmentin [Amoxicillin-Pot Clavulanate] Nausea And Vomiting   Azithromycin Swelling   Erythromycin Swelling   Flagyl [Metronidazole] Nausea And Vomiting   Lactaid [Tilactase] Diarrhea   Lactose Diarrhea, Itching and Nausea And Vomiting   Adhesive [Tape] Rash   Latex Rash   Sulfa Antibiotics Rash    Family History  Problem Relation Age of Onset   Anesthesia problems Sister    Diverticulosis Neg Hx     Prior to Admission medications  Medication Sig Start Date End Date Taking? Authorizing Provider  acebutolol (SECTRAL) 200 MG capsule Take 200 mg by mouth every morning.     [provider]  ciprofloxacin (CIPRO) 500 MG tablet Take 500 mg by mouth 2 (two) times daily. For 10 days starting 03/12/14. 03/12/14   [provider]  famotidine (PEPCID) 20 MG tablet Take 20 mg by mouth daily as needed for heartburn or indigestion.     [provider]   lisinopril-hydrochlorothiazide (PRINZIDE,ZESTORETIC) 10-12.5 MG per tablet Take 1 tablet by mouth daily.    [provider]  metroNIDAZOLE (FLAGYL) 500 MG tablet Take 500 mg by mouth 3 (three) times daily. For 10 days. 03/12/14   [provider]  ondansetron (ZOFRAN ODT) 4 MG disintegrating tablet Take 1 tablet (4 mg total) by mouth every 8 (eight) hours as needed for nausea or vomiting. Patient not taking: Reported on 09/18/2017 02/03/14   Wyatt Portela L, PA-C  rizatriptan (MAXALT-MLT) 10 MG disintegrating tablet Take 10 mg by mouth as needed. May repeat in 2 hours if needed..headache    [provider]    Physical Exam: Vitals:   02/23/22 1145 02/23/22 1235 02/23/22 1251 02/23/22 1541  BP: (!) 145/82 (!) 142/76 132/83 119/68  Pulse: 82 83 81 93  Resp: '16 20 17 17  '$ Temp: 99.4 F (37.4 C) 99.4 F (37.4 C)  (!) 101.3 F (38.5 C)  TempSrc: Oral Oral  Oral  SpO2: 97% 100% 100% 100%  Weight:      Height:       General:  Appears calm and comfortable and is in NAD Eyes:  EOMI, normal lids, iris ENT:  grossly normal hearing, lips & tongue, mmm Neck:  no LAD, masses or thyromegaly Cardiovascular:  RRR, no m/r/g. No LE edema.  Respiratory:   CTA bilaterally with no wheezes/rales/rhonchi.  Normal respiratory effort. Abdomen:  soft, TTP in LLQ without apparent rebound, ND, hypoactive BS Skin:  no rash or induration seen on limited exam Musculoskeletal:  grossly normal tone BUE/BLE, good ROM, no bony abnormality Psychiatric:  blunted mood and affect, speech fluent and appropriate, AOx3 Neurologic:  CN 2-12 grossly intact, moves all extremities in coordinated fashion   Radiological Exams on Admission: Independently reviewed - see discussion in A/P where applicable  CT ABDOMEN PELVIS W CONTRAST  Result Date: 02/23/2022 CLINICAL DATA:  Lower abdominal pain. EXAM: CT ABDOMEN AND PELVIS WITH CONTRAST TECHNIQUE: Multidetector CT imaging of the abdomen and pelvis was  performed using the standard protocol following bolus administration of intravenous contrast. RADIATION DOSE REDUCTION: This exam was performed according to the departmental dose-optimization program which includes automated exposure control, adjustment of the mA and/or kV according to patient size and/or use of iterative reconstruction technique. CONTRAST:  38m OMNIPAQUE IOHEXOL 300 MG/ML  SOLN COMPARISON:  CT with IV and oral contrast 12/11/2017. FINDINGS: Lower chest: No acute abnormality. Hepatobiliary: 18 cm length with moderate steatosis. No mass. There is increased post cholecystectomy common bile duct ectasia, now measuring 12 mm, previously 8 mm. There is no calcified ductal stone. Pancreas: Unremarkable. Spleen: Unremarkable. Adrenals/Urinary Tract: There is no adrenal mass. There is a 1.5 cm cyst of 9.8 Hounsfield units in the superior pole of the left kidney which was previously 5 mm. Remainder of the bilateral renal cortex is unremarkable. There is a 2 mm nonobstructing caliceal stone in the inferior pole of the left kidney. No other intrarenal stones are seen. There is no ureteral stone or hydronephrosis and no bladder  thickening. Stomach/Bowel: Unremarkable stomach and unopacified small bowel. The appendix is normal. Moderate retained stool ascending colon. Left-sided diverticulosis. Interval increased moderate wall thickening and surrounding inflammatory reaction involving the proximal third of the sigmoid colon. Findings consistent with acute diverticulitis. No free air or abscess is seen. Vascular/Lymphatic: No significant vascular findings are present. No enlarged abdominal or pelvic lymph nodes. Reproductive: Uterus and bilateral adnexa are unremarkable. Other: There is a small amount of nonlocalizing of layering fluid in the posterior pelvis likely reactive from the diverticulitis. There is no free air, free hemorrhage, abscess or incarcerated hernia. Multiple pelvic phleboliths both sidewalls.  Musculoskeletal: There is facet hypertrophy in the lumbar spine, greatest at L4-5 and L5-S1. Bony bridging is seen over portions of the lower left SI joint. IMPRESSION: 1. CT findings of acute sigmoid diverticulitis involving the proximal third of the sigmoid segment. There is mild nonlocalizing posterior pelvic fluid associated with this which is probably reactive. No free air or abscess is seen. Follow-up colonoscopy is recommended after treatment to exclude underlying lesion. 2. Constipation right-sided colon. 3. Increased postcholecystectomy common bile duct dilatation. Laboratory and clinical correlation suggested. There is no change in relatively mild biliary prominence along the hepatic hilum. 4. 1.5 cm left renal cyst. Tiny nonobstructing stone inferior left kidney. 5. Moderate hepatic steatosis. Electronically Signed   By: Telford Nab M.D.   On: 02/23/2022 07:45    EKG: not done   Labs on Admission: I have personally reviewed the available labs and imaging studies at the time of the admission.  Pertinent labs:    Glucose 149 WBC 13.5 HCG <5 UA 5 ketones   Assessment and Plan: Principal Problem:   Diverticulitis Active Problems:   PVC's (premature ventricular contractions)   Hypertension    Diverticulitis -Patient's symptoms are c/w diverticulitis and her CT supports this as a diagnosis -She had a prior similar episode in the past, as well -SIRS criteria are leukocytosis, fever, tachycardia - but she does not have apparently evidence of acute organ dysfunction -For now, will give bowel rest, IVF, pain medication with oxy/morphine, nausea medication with Zofran, and treat with Zosyn for intraabdominal infection -Of note, she is very reluctant to take Cipro/Flagyl PO due to prior n/v associated -If not improving, she may need GI/surgery consultation -Due to generally mild clinical presentation, will observe on med surg  HTN -Continue lisinopril  Arrhythmia -PVCs and  PACs -She takes acebutolol for this issue    Advance Care Planning:   Code Status: Full Code   Consults: None  DVT Prophylaxis: SCDs  Family Communication: Husband was present throughout evaluation  Severity of Illness: The appropriate patient status for this patient is OBSERVATION. Observation status is judged to be reasonable and necessary in order to provide the required intensity of service to ensure the patient's safety. The patient's presenting symptoms, physical exam findings, and initial radiographic and laboratory data in the context of their medical condition is felt to place them at decreased risk for further clinical deterioration. Furthermore, it is anticipated that the patient will be medically stable for discharge from the hospital within 2 midnights of admission.   Author: Karmen Bongo, MD 02/23/2022 5:25 PM  For on call review [email or web address].

## 2022-02-23 NOTE — ED Provider Notes (Signed)
Patient signed out to me with left lower quadrant pain, fever, white count of 13.5.  Concern for diverticulitis.  CT scan pending.  Patient with CT scan shows acute sigmoid diverticulitis.  There is some reactive fluid but there is no abscess or free air seen.  Patient on my reevaluation still feels nauseous and this comfort.  Does not feel like she will tolerate p.o. very well.  She struggles a lot with oral antibiotics with severe nausea and vomiting at times.  Overall given her discomfort and infectious process will admit for further supportive care and will give a dose IV Zosyn.  This chart was dictated using voice recognition software.  Despite best efforts to proofread,  errors can occur which can change the documentation meaning.    Lennice Sites, DO 02/23/22 [phone]

## 2022-02-23 NOTE — Progress Notes (Signed)
Pharmacy Antibiotic Note  Carol Hamilton is a 56 y.o. female admitted on 02/23/2022 with diverticulitis/sepsis.  Pharmacy has been consulted for zosyn dosing.  Plan: Zosyn 3.375g x1 (30 minute infusion) followed by Zosyn 3.375g IV q8h (extended infusion) Monitor renal function, cultures, and clinical progression      Temp (24hrs), Avg:100.4 F (38 C), Min:100.4 F (38 C), Max:100.4 F (38 C)  Recent Labs  Lab 02/23/22 0237  WBC 13.5*  CREATININE 0.85    CrCl cannot be calculated (Unknown ideal weight.).    Allergies  Allergen Reactions   Adhesive [Tape] Other (See Comments)    Blisters/    ALSO EKG LEAD ADHESIVE CAUSES BLISTERING   Amoxicillin Nausea And Vomiting    Severe    Augmentin [Amoxicillin-Pot Clavulanate]    Azithromycin Swelling   Erythromycin Swelling   Flagyl [Metronidazole]    Sulfa Antibiotics Rash    Antimicrobials this admission: Zosyn 7/29 >>   Dose adjustments this admission:   Microbiology results: 7/29 bcx:  Thank you for allowing pharmacy to be a part of this patient's care.  Cristela Felt, PharmD, BCPS Clinical Pharmacist 02/23/2022 8:00 AM

## 2022-02-23 NOTE — ED Triage Notes (Signed)
Patient reports pain across her lower abdomen with constipation and nausea onset last night.

## 2022-02-23 NOTE — ED Provider Notes (Signed)
[facility] EMERGENCY DEPARTMENT Provider Note   CSN: [id number] Arrival date & time: 02/23/22  0222     History  Chief Complaint  Patient presents with   Abdominal Pain    Carol Hamilton is a 56 y.o. female.  The history is provided by the patient.  Abdominal Pain Pain location:  LLQ Pain radiates to:  Does not radiate Pain severity:  Severe Onset quality:  Gradual Duration:  30 hours Timing:  Constant Progression:  Worsening Chronicity:  Recurrent Context: not laxative use   Relieved by:  Nothing Worsened by:  Nothing Ineffective treatments:  None tried Associated symptoms: constipation and nausea   Associated symptoms: no fever and no vomiting   Risk factors: has not had multiple surgeries and not pregnant        Home Medications Prior to Admission medications   Medication Sig Start Date End Date Taking? Authorizing Provider  acebutolol (SECTRAL) 200 MG capsule Take 200 mg by mouth every morning.     [provider]  ciprofloxacin (CIPRO) 500 MG tablet Take 500 mg by mouth 2 (two) times daily. For 10 days starting 03/12/14. 03/12/14   [provider]  famotidine (PEPCID) 20 MG tablet Take 20 mg by mouth daily as needed for heartburn or indigestion.     [provider]  lisinopril-hydrochlorothiazide (PRINZIDE,ZESTORETIC) 10-12.5 MG per tablet Take 1 tablet by mouth daily.    [provider]  metroNIDAZOLE (FLAGYL) 500 MG tablet Take 500 mg by mouth 3 (three) times daily. For 10 days. 03/12/14   [provider]  ondansetron (ZOFRAN ODT) 4 MG disintegrating tablet Take 1 tablet (4 mg total) by mouth every 8 (eight) hours as needed for nausea or vomiting. Patient not taking: Reported on 09/18/2017 02/03/14   Wyatt Portela L, PA-C  rizatriptan (MAXALT-MLT) 10 MG disintegrating tablet Take 10 mg by mouth as needed. May repeat in 2 hours if needed..headache    [provider]      Allergies     Adhesive [tape], Amoxicillin, Augmentin [amoxicillin-pot clavulanate], Azithromycin, Erythromycin, Flagyl [metronidazole], and Sulfa antibiotics    Review of Systems   Review of Systems  Constitutional:  Negative for fever.  HENT:  Negative for facial swelling.   Eyes:  Negative for redness.  Respiratory:  Negative for wheezing and stridor.   Gastrointestinal:  Positive for abdominal pain, constipation and nausea. Negative for vomiting.  All other systems reviewed and are negative.   Physical Exam Updated Vital Signs BP 127/89   Pulse 94   Temp (!) 100.4 F (38 C) (Oral)   Resp 19   LMP 01/07/2014   SpO2 96%  Physical Exam Vitals and nursing note reviewed.  Constitutional:      General: She is not in acute distress.    Appearance: Normal appearance. She is well-developed.  HENT:     Head: Normocephalic and atraumatic.     Nose: Nose normal.  Eyes:     Pupils: Pupils are equal, round, and reactive to light.  Cardiovascular:     Rate and Rhythm: Normal rate and regular rhythm.     Pulses: Normal pulses.     Heart sounds: Normal heart sounds.  Pulmonary:     Effort: Pulmonary effort is normal. No respiratory distress.     Breath sounds: Normal breath sounds.  Abdominal:     General: Bowel sounds are normal. There is no distension.     Tenderness: There is abdominal tenderness. There is guarding. There is  no rebound.  Genitourinary:    Vagina: No vaginal discharge.  Musculoskeletal:        General: Normal range of motion.     Cervical back: Normal range of motion and neck supple.  Skin:    General: Skin is warm and dry.     Capillary Refill: Capillary refill takes less than 2 seconds.     Findings: No erythema or rash.  Neurological:     General: No focal deficit present.     Mental Status: She is alert and oriented to person, place, and time.     Deep Tendon Reflexes: Reflexes normal.  Psychiatric:        Mood and Affect: Mood normal.     ED Results /  Procedures / Treatments   Labs (all labs ordered are listed, but only abnormal results are displayed) Results for orders placed or performed during the hospital encounter of 02/23/22  Lipase, blood  Result Value Ref Range   Lipase 30 11 - 51 U/L  Comprehensive metabolic panel  Result Value Ref Range   Sodium 138 135 - 145 mmol/L   Potassium 3.9 3.5 - 5.1 mmol/L   Chloride 104 98 - 111 mmol/L   CO2 24 22 - 32 mmol/L   Glucose, Bld 149 (H) 70 - 99 mg/dL   BUN 8 6 - 20 mg/dL   Creatinine, Ser 0.85 0.44 - 1.00 mg/dL   Calcium 9.1 8.9 - 10.3 mg/dL   Total Protein 6.9 6.5 - 8.1 g/dL   Albumin 4.0 3.5 - 5.0 g/dL   AST 21 15 - 41 U/L   ALT 24 0 - 44 U/L   Alkaline Phosphatase 66 38 - 126 U/L   Total Bilirubin 0.9 0.3 - 1.2 mg/dL   GFR, Estimated >60 >60 mL/min   Anion gap 10 5 - 15  CBC  Result Value Ref Range   WBC 13.5 (H) 4.0 - 10.5 K/uL   RBC 3.93 3.87 - 5.11 MIL/uL   Hemoglobin 12.4 12.0 - 15.0 g/dL   HCT 36.9 36.0 - 46.0 %   MCV 93.9 80.0 - 100.0 fL   MCH 31.6 26.0 - 34.0 pg   MCHC 33.6 30.0 - 36.0 g/dL   RDW 12.2 11.5 - 15.5 %   Platelets 262 150 - 400 K/uL   nRBC 0.0 0.0 - 0.2 %  Urinalysis, Routine w reflex microscopic Urine, Clean Catch  Result Value Ref Range   Color, Urine YELLOW YELLOW   APPearance CLEAR CLEAR   Specific Gravity, Urine 1.012 1.005 - 1.030   pH 6.0 5.0 - 8.0   Glucose, UA NEGATIVE NEGATIVE mg/dL   Hgb urine dipstick NEGATIVE NEGATIVE   Bilirubin Urine NEGATIVE NEGATIVE   Ketones, ur 5 (A) NEGATIVE mg/dL   Protein, ur NEGATIVE NEGATIVE mg/dL   Nitrite NEGATIVE NEGATIVE   Leukocytes,Ua NEGATIVE NEGATIVE  I-Stat beta hCG blood, ED  Result Value Ref Range   I-stat hCG, quantitative <5.0 <5 mIU/mL   Comment 3           No results found.  EKG None  Radiology No results found.  Procedures Procedures    Medications Ordered in ED Medications  fentaNYL (SUBLIMAZE) injection 50 mcg (has no administration in time range)  ondansetron  (ZOFRAN) injection 4 mg (has no administration in time range)    ED Course/ Medical Decision Making/ A&P  Medical Decision Making Patient with LLQ abdominal pain   Amount and/or Complexity of Data Reviewed External Data Reviewed: notes.    Details: previous notes reviewed Labs: ordered.    Details: All labs reviewed:  lipase normal 30 Normal sodium 138, normal potassium 3.9 normal creatinine.  Normal LFTS.  Elevated white 13.5 normal hemoglobin 12.4, normal platelets 262K, urine is negative for UTI Radiology: ordered.  Risk Prescription drug management.    Final Clinical Impression(s) / ED Diagnoses Final diagnoses:  None   Signed out to Dr. Ronnald Nian pending CT Rx / DC Orders ED Discharge Orders     None         Townsend Cudworth, MD 02/23/22 5500

## 2022-02-24 DIAGNOSIS — I10 Essential (primary) hypertension: Secondary | ICD-10-CM | POA: Diagnosis present

## 2022-02-24 DIAGNOSIS — Z91048 Other nonmedicinal substance allergy status: Secondary | ICD-10-CM | POA: Diagnosis not present

## 2022-02-24 DIAGNOSIS — K5732 Diverticulitis of large intestine without perforation or abscess without bleeding: Secondary | ICD-10-CM | POA: Diagnosis present

## 2022-02-24 DIAGNOSIS — K5792 Diverticulitis of intestine, part unspecified, without perforation or abscess without bleeding: Secondary | ICD-10-CM | POA: Diagnosis present

## 2022-02-24 DIAGNOSIS — G2581 Restless legs syndrome: Secondary | ICD-10-CM | POA: Diagnosis present

## 2022-02-24 DIAGNOSIS — I493 Ventricular premature depolarization: Secondary | ICD-10-CM | POA: Diagnosis not present

## 2022-02-24 DIAGNOSIS — Z881 Allergy status to other antibiotic agents status: Secondary | ICD-10-CM | POA: Diagnosis not present

## 2022-02-24 DIAGNOSIS — K5900 Constipation, unspecified: Secondary | ICD-10-CM | POA: Diagnosis present

## 2022-02-24 DIAGNOSIS — Z888 Allergy status to other drugs, medicaments and biological substances status: Secondary | ICD-10-CM | POA: Diagnosis not present

## 2022-02-24 DIAGNOSIS — Z882 Allergy status to sulfonamides status: Secondary | ICD-10-CM | POA: Diagnosis not present

## 2022-02-24 DIAGNOSIS — R110 Nausea: Secondary | ICD-10-CM | POA: Diagnosis present

## 2022-02-24 DIAGNOSIS — K219 Gastro-esophageal reflux disease without esophagitis: Secondary | ICD-10-CM | POA: Diagnosis present

## 2022-02-24 DIAGNOSIS — N281 Cyst of kidney, acquired: Secondary | ICD-10-CM | POA: Diagnosis present

## 2022-02-24 DIAGNOSIS — Z79899 Other long term (current) drug therapy: Secondary | ICD-10-CM | POA: Diagnosis not present

## 2022-02-24 DIAGNOSIS — Z9049 Acquired absence of other specified parts of digestive tract: Secondary | ICD-10-CM | POA: Diagnosis not present

## 2022-02-24 DIAGNOSIS — Z9079 Acquired absence of other genital organ(s): Secondary | ICD-10-CM | POA: Diagnosis not present

## 2022-02-24 NOTE — Progress Notes (Signed)
  Progress Note Patient: Carol Hamilton VWU:[id number] DOB: [birth year] DOA: 02/23/2022  DOS: the patient was seen and examined on 02/24/2022  Brief hospital course: PMH of HTN, RLS, prior history of diverticulitis now presents with complaints of LLQ  abdominal pain and fever, found to have left sigmoid diverticulitis without perforation or abscess. This is her second or third episode.  Last colonoscopy was 5 years ago. Currently we will continue with bowel rest and IV antibiotics. Assessment and Plan: Acute sigmoid diverticulitis. Without any perforation or abscess. Presents with 2-day onset of abdominal pain associated with fever and nausea. CT abdomen shows evidence of sigmoid diverticulitis. Started on IV antibiotics Currently NPO.  Will advance to clear liquid diet. Patient was also receiving IV fluid.  If tolerates the diet we will discontinue fluids tomorrow. Patient currently agreeable to transition to Unasyn tomorrow if possible and Augmentin after that based on improvement.  HTN. Currently on lisinopril on hold.  Prior history of arrhythmia. Patient is on acebutolol.  For PVC and PACs with Currently we will hold.  Left renal cyst. Benign most likely.  Monitor.  Most likely  GERD. Reported history.  Monitor.  Continue PPI.  Constipation. Still appears to be constipated.  Will monitor.  Subjective: Abdominal pain improving.  No nausea no vomiting.  No fever no chills.  Passing gas but no BM.  Physical Exam: Vitals:   02/24/22 0002 02/24/22 0423 02/24/22 0824 02/24/22 1522  BP: 106/71 117/67 120/67 115/75  Pulse: 79 75 76 82  Resp: '18 18 17 16  '$ Temp: 98.2 F (36.8 C) 98.1 F (36.7 C) 97.8 F (36.6 C) 98 F (36.7 C)  TempSrc: Oral Oral Oral Oral  SpO2: 97% 99% 100% 98%  Weight:      Height:       General: Appear in mild distress; no visible Abnormal Neck Mass Or lumps, Conjunctiva normal Cardiovascular: S1 and S2 Present, no Murmur, Respiratory: good  respiratory effort, Bilateral Air entry present and CTA, no Crackles, no wheezes Abdomen: Bowel Sound present, diffuse central and left-sided abdominal tenderness. Extremities: no Pedal edema Neurology: alert and oriented to time, place, and person  Gait not checked due to patient safety concerns   Data Reviewed: I have Reviewed nursing notes, Vitals, and Lab results since pt's last encounter. Pertinent lab results BC and BMP I have ordered test including CBC and BMP I have independently visualized and interpreted imaging CT abdomen and pelvis which showed significant constipation.   Family Communication: Husband at bedside  Disposition: Status is: Inpatient Remains inpatient appropriate because: Need for IV antibiotics currently at bowel rest  Author: Berle Mull, MD 02/24/2022 7:40 PM  Please look on [email or web address] to find out who is on call.

## 2022-02-24 NOTE — Hospital Course (Signed)
PMH of HTN, RLS, prior history of diverticulitis now presents with complaints of LLQ  abdominal pain and fever, found to have left sigmoid diverticulitis without perforation or abscess. This is her second or third episode.  Last colonoscopy was 5 years ago. Treated conservatively with bowel rest and IV antibiotics.

## 2022-02-25 DIAGNOSIS — K5792 Diverticulitis of intestine, part unspecified, without perforation or abscess without bleeding: Secondary | ICD-10-CM | POA: Diagnosis not present

## 2022-02-25 LAB — CBC WITH DIFFERENTIAL/PLATELET
Abs Immature Granulocytes: 0.02 10*3/uL (ref 0.00–0.07)
Basophils Absolute: 0 10*3/uL (ref 0.0–0.1)
Basophils Relative: 1 %
Eosinophils Absolute: 0.1 10*3/uL (ref 0.0–0.5)
Eosinophils Relative: 2 %
HCT: 30.7 % — ABNORMAL LOW (ref 36.0–46.0)
Hemoglobin: 10.5 g/dL — ABNORMAL LOW (ref 12.0–15.0)
Immature Granulocytes: 0 %
Lymphocytes Relative: 21 %
Lymphs Abs: 1.6 10*3/uL (ref 0.7–4.0)
MCH: 31.3 pg (ref 26.0–34.0)
MCHC: 34.2 g/dL (ref 30.0–36.0)
MCV: 91.4 fL (ref 80.0–100.0)
Monocytes Absolute: 0.6 10*3/uL (ref 0.1–1.0)
Monocytes Relative: 8 %
Neutro Abs: 5.3 10*3/uL (ref 1.7–7.7)
Neutrophils Relative %: 68 %
Platelets: 217 10*3/uL (ref 150–400)
RBC: 3.36 MIL/uL — ABNORMAL LOW (ref 3.87–5.11)
RDW: 12 % (ref 11.5–15.5)
WBC: 7.7 10*3/uL (ref 4.0–10.5)
nRBC: 0 % (ref 0.0–0.2)

## 2022-02-25 LAB — COMPREHENSIVE METABOLIC PANEL
ALT: 42 U/L (ref 0–44)
AST: 34 U/L (ref 15–41)
Albumin: 3.1 g/dL — ABNORMAL LOW (ref 3.5–5.0)
Alkaline Phosphatase: 66 U/L (ref 38–126)
Anion gap: 8 (ref 5–15)
BUN: 8 mg/dL (ref 6–20)
CO2: 25 mmol/L (ref 22–32)
Calcium: 8.6 mg/dL — ABNORMAL LOW (ref 8.9–10.3)
Chloride: 105 mmol/L (ref 98–111)
Creatinine, Ser: 0.88 mg/dL (ref 0.44–1.00)
GFR, Estimated: >60 mL/min (ref >60)
Glucose, Bld: 149 mg/dL — ABNORMAL HIGH (ref 70–99)
Potassium: 3.4 mmol/L — ABNORMAL LOW (ref 3.5–5.1)
Sodium: 138 mmol/L (ref 135–145)
Total Bilirubin: 1.3 mg/dL — ABNORMAL HIGH (ref 0.3–1.2)
Total Protein: 5.9 g/dL — ABNORMAL LOW (ref 6.5–8.1)

## 2022-02-25 MED ORDER — POTASSIUM CHLORIDE CRYS ER 20 MEQ PO TBCR
40.0000 meq | EXTENDED_RELEASE_TABLET | Freq: Once | ORAL | Status: AC
Start: 2022-02-25 — End: 2022-02-25
  Administered 2022-02-25: 40 meq via ORAL
  Filled 2022-02-25: qty 2

## 2022-02-25 MED ORDER — SODIUM CHLORIDE 0.9 % IV SOLN
3.0000 g | Freq: Four times a day (QID) | INTRAVENOUS | Status: DC
  Administered 2022-02-25 – 2022-02-26 (×4): 3 g via INTRAVENOUS
  Filled 2022-02-25 (×4): qty 8

## 2022-02-25 MED ORDER — ACEBUTOLOL HCL 200 MG PO CAPS
200.0000 mg | ORAL_CAPSULE | Freq: Every morning | ORAL | Status: DC
  Administered 2022-02-25 – 2022-02-27 (×3): 200 mg via ORAL
  Filled 2022-02-25 (×4): qty 1

## 2022-02-25 NOTE — Progress Notes (Signed)
  Progress Note Patient: Carol Hamilton [id number]:[id number] DOB: 30-Nov-1965 DOA: 02/23/2022  DOS: the patient was seen and examined on 02/25/2022  Brief hospital course: PMH of HTN, RLS, prior history of diverticulitis now presents with complaints of LLQ  abdominal pain and fever, found to have left sigmoid diverticulitis without perforation or abscess. This is her second or third episode.  Last colonoscopy was 5 years ago. Currently we will continue with bowel rest and IV antibiotics. Assessment and Plan: Acute sigmoid diverticulitis. Without any perforation or abscess. Presents with 2-day onset of abdominal pain associated with fever and nausea. CT abdomen shows evidence of sigmoid diverticulitis. Started on IV antibiotics Tolerating clear liquid diet.  Will advance to full liquid diet and monitor. Antibiotics switched from IV Zosyn to IV Unasyn. Discontinue IV fluids.   If tolerates transition to soft diet tomorrow and Augmentin  HTN. Currently on lisinopril on hold.  Prior history of arrhythmia. Patient is on acebutolol.  For PVC and PACs Resume  Left renal cyst. Benign most likely.  Monitor.  Most likely  GERD. Reported history.  Monitor.  Continue PPI.  Constipation. Resolved.  Subjective: Abdominal pain still present but improving after having bowel movements.  No blood in the stool.  No nausea no vomiting.  Physical Exam: Vitals:   02/25/22 0452 02/25/22 0756 02/25/22 1651 02/25/22 2006  BP: 116/60 130/84 132/83 136/83  Pulse: 74 77 76 83  Resp: '16 18 18 16  '$ Temp: 98.2 F (36.8 C) 98.6 F (37 C) 98.8 F (37.1 C) 98 F (36.7 C)  TempSrc: Oral Oral Oral Oral  SpO2: 100% 98% 100% 100%  Weight:      Height:       General: Appear in mild distress, no Rash; Oral Mucosa Clear, moist. no Abnormal Neck Mass Or lumps, Conjunctiva normal  Cardiovascular: S1 and S2 Present, no Murmur, Respiratory: good respiratory effort, Bilateral Air entry present and CTA, no Crackles, no  wheezes Abdomen: Bowel Sound present, Soft and mild diffuse tenderness Extremities: no Pedal edema Neurology: alert and oriented to time, place, and person affect appropriate. no new focal deficit Gait not checked due to patient safety concerns  Data Reviewed: I have Reviewed nursing notes, Vitals, and Lab results since pt's last encounter. Pertinent lab results CBC and BMP I have ordered test including BMP and magnesium     Family Communication: No one at bedside  Disposition: Status is: Inpatient Remains inpatient appropriate because: Need for IV antibiotics currently at bowel rest  Author: Berle Mull, MD 02/25/2022 8:31 PM  Please look on [email or web address] to find out who is on call.

## 2022-02-25 NOTE — Plan of Care (Signed)

## 2022-02-25 NOTE — Progress Notes (Signed)
Pharmacy Antibiotic Note  Carol Hamilton is a 56 y.o. female admitted on 02/23/2022 with diverticulitis/sepsis.  Pharmacy has been consulted to transition Zosyn to Unasyn dosing. BCx negative to date.  Plan: D/c Zosyn >> Unasyn 3g IV q6h Monitor renal function, cultures, and clinical progression   Height: '5\' 10"'$  (177.8 cm) Weight: 90.3 kg (199 lb) IBW/kg (Calculated) : 68.5  Temp (24hrs), Avg:98.2 F (36.8 C), Min:98 F (36.7 C), Max:98.6 F (37 C)  Recent Labs  Lab 02/23/22 0237 02/23/22 0831  WBC 13.5*  --   CREATININE 0.85  --   LATICACIDVEN  --  1.3     Estimated Creatinine Clearance: 87.9 mL/min (by C-G formula based on SCr of 0.85 mg/dL).    Allergies  Allergen Reactions   Amoxil [Amoxicillin] Nausea And Vomiting   Augmentin [Amoxicillin-Pot Clavulanate] Nausea And Vomiting   Azithromycin Swelling   Erythromycin Swelling   Flagyl [Metronidazole] Nausea And Vomiting   Lactaid [Tilactase] Diarrhea   Lactose Diarrhea, Itching and Nausea And Vomiting   Adhesive [Tape] Rash   Latex Rash   Sulfa Antibiotics Rash    Carol Hamilton, PharmD, BCPS Please check AMION for all Carol Hamilton contact numbers Clinical Pharmacist 02/25/2022 8:35 AM

## 2022-02-26 DIAGNOSIS — K5792 Diverticulitis of intestine, part unspecified, without perforation or abscess without bleeding: Secondary | ICD-10-CM | POA: Diagnosis not present

## 2022-02-26 LAB — BASIC METABOLIC PANEL
Anion gap: 12 (ref 5–15)
BUN: 5 mg/dL — ABNORMAL LOW (ref 6–20)
CO2: 22 mmol/L (ref 22–32)
Calcium: 9.3 mg/dL (ref 8.9–10.3)
Chloride: 108 mmol/L (ref 98–111)
Creatinine, Ser: 0.74 mg/dL (ref 0.44–1.00)
GFR, Estimated: >60 mL/min (ref >60)
Glucose, Bld: 97 mg/dL (ref 70–99)
Potassium: 3.9 mmol/L (ref 3.5–5.1)
Sodium: 142 mmol/L (ref 135–145)

## 2022-02-26 LAB — MAGNESIUM: Magnesium: 1.9 mg/dL (ref 1.7–2.4)

## 2022-02-26 MED ORDER — SACCHAROMYCES BOULARDII 250 MG PO CAPS
250.0000 mg | ORAL_CAPSULE | Freq: Two times a day (BID) | ORAL | Status: DC
Start: 2022-02-26 — End: 2022-02-27
  Administered 2022-02-26 – 2022-02-27 (×3): 250 mg via ORAL
  Filled 2022-02-26 (×3): qty 1

## 2022-02-26 MED ORDER — LISINOPRIL 10 MG PO TABS
10.0000 mg | ORAL_TABLET | Freq: Every day | ORAL | Status: DC
  Administered 2022-02-26 – 2022-02-27 (×2): 10 mg via ORAL
  Filled 2022-02-26 (×2): qty 1

## 2022-02-26 MED ORDER — AMOXICILLIN-POT CLAVULANATE 875-125 MG PO TABS
1.0000 | ORAL_TABLET | Freq: Two times a day (BID) | ORAL | Status: DC
  Administered 2022-02-26 – 2022-02-27 (×2): 1 via ORAL
  Filled 2022-02-26 (×2): qty 1

## 2022-02-26 NOTE — Progress Notes (Signed)
  Progress Note Patient: Carol Hamilton [id number]:[id number] DOB: [birth year] DOA: 02/23/2022  DOS: the patient was seen and examined on 02/26/2022  Brief hospital course: PMH of HTN, RLS, prior history of diverticulitis now presents with complaints of LLQ  abdominal pain and fever, found to have left sigmoid diverticulitis without perforation or abscess. This is her second or third episode.  Last colonoscopy was 5 years ago. Treated conservatively with bowel rest and IV antibiotics.  Assessment and Plan: Acute sigmoid diverticulitis. Without any perforation or abscess. Presents with 2-day onset of abdominal pain associated with fever and nausea. CT abdomen shows evidence of sigmoid diverticulitis. Started on IV antibiotics Tolerating full liquid diet.  Advancing to soft diet. Initially was on IV Zosyn then IV Unasyn now on oral Augmentin. Patient had severe nausea and vomiting with Augmentin therefore we will monitor overnight for stability before discharging home tomorrow.  HTN. Currently on lisinopril   Prior history of arrhythmia. Patient is on acebutolol.  For PVC and PAC   Left renal cyst. Benign most likely.  Monitor.  Most likely   GERD. Reported history.  Monitor.  Continue PPI.   Constipation. Resolved.  Subjective: Feels wiped out although abdominal pain improving.  Had 3 loose watery BM today.  No blood in the stool.  Oral intake improving.  Physical Exam: Vitals:   02/25/22 2006 02/26/22 0538 02/26/22 0816 02/26/22 1545  BP: 136/83 123/80 (!) 149/84 (!) 145/70  Pulse: 83 74 79 80  Resp: '16 16 17 16  '$ Temp: 98 F (36.7 C) 98.2 F (36.8 C) 98.5 F (36.9 C) 98.4 F (36.9 C)  TempSrc: Oral Oral Oral Oral  SpO2: 100% 99% 99% 98%  Weight:      Height:       General: Appear in mild distress; no visible Abnormal Neck Mass Or lumps, Conjunctiva normal Cardiovascular: S1 and S2 Present, no Murmur, Respiratory: good respiratory effort, Bilateral Air entry present and CTA,  no Crackles, no wheezes Abdomen: Bowel Sound present, improving abdominal tenderness Extremities: no Pedal edema Neurology: alert and oriented to time, place, and person  Gait not checked due to patient safety concerns   Data Reviewed: I have Reviewed nursing notes, Vitals, and Lab results since pt's last encounter. Pertinent lab results CBC and BMP    Family Communication: None at bedside  Disposition: Status is: Inpatient Remains inpatient appropriate because: Monitoring for tolerance for antibiotics  Author: Berle Mull, MD 02/26/2022 9:09 PM  Please look on [email or web address] to find out who is on call.

## 2022-02-27 DIAGNOSIS — K5792 Diverticulitis of intestine, part unspecified, without perforation or abscess without bleeding: Secondary | ICD-10-CM | POA: Diagnosis not present

## 2022-02-27 LAB — BASIC METABOLIC PANEL
Anion gap: 6 (ref 5–15)
BUN: 8 mg/dL (ref 6–20)
CO2: 24 mmol/L (ref 22–32)
Calcium: 8.7 mg/dL — ABNORMAL LOW (ref 8.9–10.3)
Chloride: 109 mmol/L (ref 98–111)
Creatinine, Ser: 0.98 mg/dL (ref 0.44–1.00)
GFR, Estimated: >60 mL/min (ref >60)
Glucose, Bld: 141 mg/dL — ABNORMAL HIGH (ref 70–99)
Potassium: 3.6 mmol/L (ref 3.5–5.1)
Sodium: 139 mmol/L (ref 135–145)

## 2022-02-27 LAB — MAGNESIUM: Magnesium: 1.8 mg/dL (ref 1.7–2.4)

## 2022-02-27 MED ORDER — SACCHAROMYCES BOULARDII 250 MG PO CAPS: 250.0000 mg | ORAL_CAPSULE | Freq: Two times a day (BID) | ORAL | 0 refills | Status: AC

## 2022-02-27 MED ORDER — ONDANSETRON HCL 4 MG PO TABS: 4.0000 mg | ORAL_TABLET | Freq: Every day | ORAL | 1 refills | Status: DC | PRN

## 2022-02-27 MED ORDER — AMOXICILLIN-POT CLAVULANATE 875-125 MG PO TABS: 1.0000 | ORAL_TABLET | Freq: Two times a day (BID) | ORAL | 0 refills | Status: AC

## 2022-02-27 NOTE — Discharge Summary (Signed)
Physician Discharge Summary  Carol Hamilton IRS:[id number] DOB: 1966-07-01 DOA: 02/23/2022  PCP: Orpah Melter, MD  Admit date: 02/23/2022 Discharge date: 02/27/2022  Admitted From: Home Disposition:  Home  Discharge Condition:Stable CODE STATUS:FULL Diet recommendation: Heart Healthy   Brief/Interim Summary:  Patient is a 56 year old female with history of  HTN, RLS, prior history of diverticulitis now presents with complaints of LLQ  abdominal pain and fever, found to have left sigmoid diverticulitis without perforation or abscess.This is her second or third episode.  Last colonoscopy was 5 years ago.  Patient was started on IV antibiotics, bowel rest.  She has symptomatically improved.  Hospital course also remarkable for mild diarrhea, nausea.  Currently she is tolerating solid diet, still has some nausea.  Patient is medically stable.  She wants to go home today with oral antibiotics.  Following problems were addressed during her hospitalization:  Acute sigmoid diverticulitis. Without any perforation or abscess. Presents with 2-day onset of abdominal pain associated with fever and nausea. CT abdomen shows evidence of sigmoid diverticulitis. Started on IV antibiotics,now transition to oral Denies any abdomen pain today.  Tolerating solid diet. She has history of diverticulitis in the past.  We recommend to follow-up with her gastroenterologist in 6 to 8 weeks for follow-up colonoscopy.  HTN. Currently on lisinopril   Prior history of arrhythmia. Patient is on acebutolol.  For PVC and PAC   Left renal cyst. Benign most likely.  Monitor as an outpatient   GERD. Continue PPI.   Nausea/diarrhea Mild diarrhea.  Patient does not look dehydrated.  Encouraged her to drink plenty of fluids at home.  Started on probiotics.  Low suspicion for C. difficile.  Continue Zofran for nausea.  Most likely associated with antibiotic.   Discharge Diagnoses:  Principal Problem:    Diverticulitis Active Problems:   PVC's (premature ventricular contractions)   Hypertension    Discharge Instructions  Discharge Instructions     Diet - low sodium heart healthy   Complete by: As directed    Discharge instructions   Complete by: As directed    1)Please take prescribed medications as instructed 2)Follow up with your PCP in a week 3)Follow up with your gastroenterologist in 6 to 8 weeks for colonoscopy procedure   Increase activity slowly   Complete by: As directed       Allergies as of 02/27/2022       Reactions   Amoxil [amoxicillin] Nausea And Vomiting   Augmentin [amoxicillin-pot Clavulanate] Nausea And Vomiting   Azithromycin Swelling   Erythromycin Swelling   Flagyl [metronidazole] Nausea And Vomiting   Lactaid [tilactase] Diarrhea   Lactose Diarrhea, Itching, Nausea And Vomiting   Adhesive [tape] Rash   Latex Rash   Sulfa Antibiotics Rash        Medication List     TAKE these medications    acebutolol 200 MG capsule Commonly known as: SECTRAL Take 200 mg by mouth every morning.   acetaminophen 325 MG tablet Commonly known as: TYLENOL Take 650 mg by mouth daily as needed for mild pain.   albuterol 108 (90 Base) MCG/ACT inhaler Commonly known as: VENTOLIN HFA Inhale 2 puffs into the lungs every 6 (six) hours as needed for wheezing or shortness of breath.   amoxicillin-clavulanate 875-125 MG tablet Commonly known as: AUGMENTIN Take 1 tablet by mouth 2 (two) times daily after a meal for 6 days.   CENTRUM SILVER 50+WOMEN PO Take 1 tablet by mouth daily.   EPINEPHrine 0.3 mg/0.3 mL Soaj  injection Commonly known as: EPI-PEN Inject 0.3 mg into the muscle as needed for anaphylaxis.   famotidine 20 MG tablet Commonly known as: PEPCID Take 20 mg by mouth daily.   fluticasone 50 MCG/ACT nasal spray Commonly known as: FLONASE Place 1 spray into both nostrils daily.   ipratropium 0.06 % nasal spray Commonly known as: ATROVENT Place 1  spray into both nostrils daily.   lisinopril 10 MG tablet Commonly known as: ZESTRIL Take 10 mg by mouth daily.   ondansetron 4 MG tablet Commonly known as: Zofran Take 1 tablet (4 mg total) by mouth daily as needed for nausea or vomiting.   rizatriptan 10 MG disintegrating tablet Commonly known as: MAXALT-MLT Take 10 mg by mouth See admin instructions. 10 mg PRN migraine, may repeat 10 mg in 2 hours if needed   saccharomyces boulardii 250 MG capsule Commonly known as: FLORASTOR Take 1 capsule (250 mg total) by mouth 2 (two) times daily for 14 days.        Follow-up Information     Orpah Melter, MD. Schedule an appointment as soon as possible for a visit in 1 week(s).   Specialty: Family Medicine Contact information: [place] [address] [phone]                Allergies  Allergen Reactions   Amoxil [Amoxicillin] Nausea And Vomiting   Augmentin [Amoxicillin-Pot Clavulanate] Nausea And Vomiting   Azithromycin Swelling   Erythromycin Swelling   Flagyl [Metronidazole] Nausea And Vomiting   Lactaid [Tilactase] Diarrhea   Lactose Diarrhea, Itching and Nausea And Vomiting   Adhesive [Tape] Rash   Latex Rash   Sulfa Antibiotics Rash    Consultations: None   Procedures/Studies: CT ABDOMEN PELVIS W CONTRAST  Result Date: 02/23/2022 CLINICAL DATA:  Lower abdominal pain. EXAM: CT ABDOMEN AND PELVIS WITH CONTRAST TECHNIQUE: Multidetector CT imaging of the abdomen and pelvis was performed using the standard protocol following bolus administration of intravenous contrast. RADIATION DOSE REDUCTION: This exam was performed according to the departmental dose-optimization program which includes automated exposure control, adjustment of the mA and/or kV according to patient size and/or use of iterative reconstruction technique. CONTRAST:  18m OMNIPAQUE IOHEXOL 300 MG/ML  SOLN COMPARISON:  CT with IV and oral contrast 12/11/2017. FINDINGS: Lower  chest: No acute abnormality. Hepatobiliary: 18 cm length with moderate steatosis. No mass. There is increased post cholecystectomy common bile duct ectasia, now measuring 12 mm, previously 8 mm. There is no calcified ductal stone. Pancreas: Unremarkable. Spleen: Unremarkable. Adrenals/Urinary Tract: There is no adrenal mass. There is a 1.5 cm cyst of 9.8 Hounsfield units in the superior pole of the left kidney which was previously 5 mm. Remainder of the bilateral renal cortex is unremarkable. There is a 2 mm nonobstructing caliceal stone in the inferior pole of the left kidney. No other intrarenal stones are seen. There is no ureteral stone or hydronephrosis and no bladder thickening. Stomach/Bowel: Unremarkable stomach and unopacified small bowel. The appendix is normal. Moderate retained stool ascending colon. Left-sided diverticulosis. Interval increased moderate wall thickening and surrounding inflammatory reaction involving the proximal third of the sigmoid colon. Findings consistent with acute diverticulitis. No free air or abscess is seen. Vascular/Lymphatic: No significant vascular findings are present. No enlarged abdominal or pelvic lymph nodes. Reproductive: Uterus and bilateral adnexa are unremarkable. Other: There is a small amount of nonlocalizing of layering fluid in the posterior pelvis likely reactive from the diverticulitis. There is no free air, free  hemorrhage, abscess or incarcerated hernia. Multiple pelvic phleboliths both sidewalls. Musculoskeletal: There is facet hypertrophy in the lumbar spine, greatest at L4-5 and L5-S1. Bony bridging is seen over portions of the lower left SI joint. IMPRESSION: 1. CT findings of acute sigmoid diverticulitis involving the proximal third of the sigmoid segment. There is mild nonlocalizing posterior pelvic fluid associated with this which is probably reactive. No free air or abscess is seen. Follow-up colonoscopy is recommended after treatment to exclude  underlying lesion. 2. Constipation right-sided colon. 3. Increased postcholecystectomy common bile duct dilatation. Laboratory and clinical correlation suggested. There is no change in relatively mild biliary prominence along the hepatic hilum. 4. 1.5 cm left renal cyst. Tiny nonobstructing stone inferior left kidney. 5. Moderate hepatic steatosis. Electronically Signed   By: Telford Nab M.D.   On: 02/23/2022 07:45      Subjective: Patient seen and examined at bedside this morning.  Hemodynamically stable for discharge today.  Discharge Exam: Vitals:   02/27/22 0600 02/27/22 0752  BP: 121/77 108/85  Pulse: 96 91  Resp: 17 17  Temp: 98.7 F (37.1 C) 98.1 F (36.7 C)  SpO2: 98% 98%   Vitals:   02/26/22 1545 02/26/22 2113 02/27/22 0600 02/27/22 0752  BP: (!) 145/70 (!) 144/84 121/77 108/85  Pulse: 80 80 96 91  Resp: '16 17 17 17  '$ Temp: 98.4 F (36.9 C) 98.6 F (37 C) 98.7 F (37.1 C) 98.1 F (36.7 C)  TempSrc: Oral Oral Oral Oral  SpO2: 98% 100% 98% 98%  Weight:      Height:        General: Pt is alert, awake, not in acute distress Cardiovascular: RRR, S1/S2 +, no rubs, no gallops Respiratory: CTA bilaterally, no wheezing, no rhonchi Abdominal: Soft, NT, ND, bowel sounds + Extremities: no edema, no cyanosis    The results of significant diagnostics from this hospitalization (including imaging, microbiology, ancillary and laboratory) are listed below for reference.     Microbiology: Recent Results (from the past 240 hour(s))  Blood culture (routine x 2)     Status: None (Preliminary result)   Collection Time: 02/23/22  7:58 AM   Specimen: BLOOD  Result Value Ref Range Status   Specimen Description BLOOD LEFT ANTECUBITAL  Final   Special Requests   Final    BOTTLES DRAWN AEROBIC AND ANAEROBIC Blood Culture adequate volume   Culture   Final    NO GROWTH 4 DAYS Performed at [facility], [address], [place], [place] [address]    Report Status PENDING   Incomplete  Blood culture (routine x 2)     Status: None (Preliminary result)   Collection Time: 02/23/22  8:03 AM   Specimen: BLOOD  Result Value Ref Range Status   Specimen Description BLOOD RIGHT ANTECUBITAL  Final   Special Requests   Final    BOTTLES DRAWN AEROBIC AND ANAEROBIC Blood Culture adequate volume   Culture   Final    NO GROWTH 4 DAYS Performed at [facility], [place] [address], [place], [place] [address]    Report Status PENDING  Incomplete     Labs: BNP (last 3 results) No results for input(s): "BNP" in the last 8760 hours. Basic Metabolic Panel: Recent Labs  Lab 02/23/22 0237 02/25/22 0841 02/26/22 0519 02/27/22 0423  NA 138 138 142 139  K 3.9 3.4* 3.9 3.6  CL 104 105 108 109  CO2 '24 25 22 24  '$ GLUCOSE 149* 149* 97 141*  BUN 8 8 5* 8  CREATININE 0.85 0.88 0.74 0.98  CALCIUM 9.1 8.6* 9.3 8.7*  MG  --   --  1.9 1.8   Liver Function Tests: Recent Labs  Lab 02/23/22 0237 02/25/22 0841  AST 21 34  ALT 24 42  ALKPHOS 66 66  BILITOT 0.9 1.3*  PROT 6.9 5.9*  ALBUMIN 4.0 3.1*   Recent Labs  Lab 02/23/22 0237  LIPASE 30   No results for input(s): "AMMONIA" in the last 168 hours. CBC: Recent Labs  Lab 02/23/22 0237 02/25/22 0841  WBC 13.5* 7.7  NEUTROABS  --  5.3  HGB 12.4 10.5*  HCT 36.9 30.7*  MCV 93.9 91.4  PLT 262 217   Cardiac Enzymes: No results for input(s): "CKTOTAL", "CKMB", "CKMBINDEX", "TROPONINI" in the last 168 hours. BNP: Invalid input(s): "POCBNP" CBG: No results for input(s): "GLUCAP" in the last 168 hours. D-Dimer No results for input(s): "DDIMER" in the last 72 hours. Hgb A1c No results for input(s): "HGBA1C" in the last 72 hours. Lipid Profile No results for input(s): "CHOL", "HDL", "LDLCALC", "TRIG", "CHOLHDL", "LDLDIRECT" in the last 72 hours. Thyroid function studies No results for input(s): "TSH", "T4TOTAL", "T3FREE", "THYROIDAB" in the last 72 hours.  Invalid input(s): "FREET3" Anemia work up No  results for input(s): "VITAMINB12", "FOLATE", "FERRITIN", "TIBC", "IRON", "RETICCTPCT" in the last 72 hours. Urinalysis    Component Value Date/Time   COLORURINE YELLOW 02/23/2022 0232   APPEARANCEUR CLEAR 02/23/2022 0232   LABSPEC 1.012 02/23/2022 0232   PHURINE 6.0 02/23/2022 0232   GLUCOSEU NEGATIVE 02/23/2022 0232   HGBUR NEGATIVE 02/23/2022 0232   BILIRUBINUR NEGATIVE 02/23/2022 0232   KETONESUR 5 (A) 02/23/2022 0232   PROTEINUR NEGATIVE 02/23/2022 0232   UROBILINOGEN 0.2 03/18/2014 0843   NITRITE NEGATIVE 02/23/2022 0232   LEUKOCYTESUR NEGATIVE 02/23/2022 0232   Sepsis Labs Recent Labs  Lab 02/23/22 0237 02/25/22 0841  WBC 13.5* 7.7   Microbiology Recent Results (from the past 240 hour(s))  Blood culture (routine x 2)     Status: None (Preliminary result)   Collection Time: 02/23/22  7:58 AM   Specimen: BLOOD  Result Value Ref Range Status   Specimen Description BLOOD LEFT ANTECUBITAL  Final   Special Requests   Final    BOTTLES DRAWN AEROBIC AND ANAEROBIC Blood Culture adequate volume   Culture   Final    NO GROWTH 4 DAYS Performed at [facility], [address], [place], [place] [address]    Report Status PENDING  Incomplete  Blood culture (routine x 2)     Status: None (Preliminary result)   Collection Time: 02/23/22  8:03 AM   Specimen: BLOOD  Result Value Ref Range Status   Specimen Description BLOOD RIGHT ANTECUBITAL  Final   Special Requests   Final    BOTTLES DRAWN AEROBIC AND ANAEROBIC Blood Culture adequate volume   Culture   Final    NO GROWTH 4 DAYS Performed at [facility], [place] [address], [place], [place] [address]    Report Status PENDING  Incomplete    Please note: You were cared for by a hospitalist during your hospital stay. Once you are discharged, your primary care physician will handle any further medical issues. Please note that NO REFILLS for any discharge medications will be authorized once you are discharged, as it is  imperative that you return to your primary care physician (or establish a relationship with a primary care physician if you do not have  one) for your post hospital discharge needs so that they can reassess your need for medications and monitor your lab values.    Time coordinating discharge: 40 minutes  SIGNED:   Shelly Coss, MD  [facility] 02/27/2022, 10:36 AM Pager [phone]  If 7PM-7AM, please contact night-coverage [email or web address] Password [email or web address]

## 2022-02-27 NOTE — Progress Notes (Signed)
Discharge instructions reviewed with pt and her husband.  Copy of instructions given to pt, pt informed her scripts were sent to her pharmacy.  Pt d/c'd via wheelchair with belongings, with her husband.           Escorted by staff.

## 2022-02-28 LAB — CULTURE, BLOOD (ROUTINE X 2)
Culture: NO GROWTH
Culture: NO GROWTH
Special Requests: ADEQUATE
Special Requests: ADEQUATE

## 2022-03-26 ENCOUNTER — Ambulatory Visit: Payer: BC Managed Care – PPO | Admitting: Allergy

## 2022-03-28 ENCOUNTER — Other Ambulatory Visit: Payer: Self-pay | Admitting: Gastroenterology

## 2022-03-28 ENCOUNTER — Ambulatory Visit
Admission: RE | Admit: 2022-03-28 | Discharge: 2022-03-28 | Disposition: A | Discharge status: Home / Self Care | Payer: BC Managed Care – PPO | Source: Ambulatory Visit | Attending: Gastroenterology | Admitting: Gastroenterology

## 2022-03-28 ENCOUNTER — Other Ambulatory Visit (HOSPITAL_COMMUNITY): Payer: Self-pay

## 2022-03-28 DIAGNOSIS — K5792 Diverticulitis of intestine, part unspecified, without perforation or abscess without bleeding: Secondary | ICD-10-CM

## 2022-03-28 DIAGNOSIS — R1310 Dysphagia, unspecified: Secondary | ICD-10-CM

## 2022-03-28 DIAGNOSIS — R1032 Left lower quadrant pain: Secondary | ICD-10-CM

## 2022-03-28 MED ORDER — IOPAMIDOL (ISOVUE-300) INJECTION 61%
100.0000 mL | Freq: Once | INTRAVENOUS | Status: AC | PRN
  Administered 2022-03-28: 100 mL via INTRAVENOUS

## 2022-04-01 NOTE — Progress Notes (Signed)
New Patient Note  RE: Carol Hamilton MRN: [id number] DOB: 1965-08-20 Date of Office Visit: 04/02/2022  Consult requested by: Sheran Spine, FNP Primary care provider: Orpah Melter, MD  Chief Complaint: Allergic Reaction (DAIRY )  History of Present Illness: I had the pleasure of seeing Carol Hamilton for initial evaluation at the [facility] on 04/02/2022. She is a 56 y.o. female, who is referred here by Orpah Melter, MD for the evaluation of milk allergy.  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 She had intolerance for many years and symptoms significantly improved after completely removing it from her diet including baked milk products.   In July patient went out to eat and had a vegetable burrito without any dairy. Patient ate half of her dinner and about 1 hour afterwards she had diarrhea, vomiting, diaphoresis, confused and possible low pressure. She also noted some wheezing - used albuterol for about 1 week afterwards.  She is concerned that the food was contaminated with dairy.  Nobody else got food poisoning.  Denies any hives, swelling.   Denies any associated cofactors such as exertion, infection, NSAID use, or alcohol consumption. The symptoms lasted for a few hours. She was not evaluated in ED. Since this episode, she does not report other accidental exposures to dairy. She does have access to epinephrine autoinjector and not needed to use it.   Past work up includes: none. Dietary History: patient has been eating other foods including eggs, peanut, treenuts, sesame, shellfish, fish, soy, wheat, meats, fruits and vegetables.  She reports reading labels and avoiding dairy including baked dairy in diet completely.  No recent tick bites. Does not consume red meat frequently.  Currently being treated for diverticulitis.  Assessment and Plan: Carol Hamilton is a 56 y.o. female with: Other adverse food reactions, not elsewhere classified, subsequent  encounter Avoiding dairy for many years due to GI symptoms but recently may had cross contamination leading to vomiting, diarrhea, diaphoresis and feeling lightheaded/confused with some wheezing. No prior work up. No recent tick bites. Minimal red meat exposure. Today's skin testing showed: Negative milk and casein. Food allergen skin testing has excellent negative predictive value however there is still a small chance that the allergy exists. Therefore, we will investigate further with serum specific IgE levels. Continue strict avoidance of dairy. For mild symptoms you can take over the counter antihistamines such as Benadryl and monitor symptoms closely. If symptoms worsen or if you have severe symptoms including breathing issues, throat closure, significant swelling, whole body hives, severe diarrhea and vomiting, lightheadedness then inject epinephrine and seek immediate medical care afterwards. Emergency action plan given. Get bloodwork.   Wheezing No prior history of asthma but used albuterol after above incident for about 1 week with good benefit. Today's spirometry showed some mild restriction - most likely due to body habitus. May use albuterol rescue inhaler 2 puffs 4 to 6 hours as needed for shortness of breath, chest tightness, coughing, and wheezing. Monitor frequency of use.   Other allergic rhinitis Some symptoms in the fall.  Use over the counter antihistamines such as Zyrtec (cetirizine), Claritin (loratadine), Allegra (fexofenadine), or Xyzal (levocetirizine) daily as needed. May take twice a day during allergy flares. May switch antihistamines every few months. If symptoms worsen then recommend testing - declined today.  Return in about 6 months (around 10/01/2022).  No orders of the defined types were placed in this encounter.  Lab Orders         IgE  Milk w/ Component Reflex         Alpha-Gal Panel      Other allergy screening: Asthma: no Used albuterol for a week  after the above episode with some benefit.  Rhino conjunctivitis: yes Some rhinorrhea in the fall.   Medication allergy: yes Sulfa and zpak - rash. Hymenoptera allergy: no Urticaria: no Eczema:no History of recurrent infections suggestive of immunodeficency: no  Diagnostics: Spirometry:  Tracings reviewed. Her effort: Good reproducible efforts. FVC: 3.06L FEV1: 2.49L, 78% predicted FEV1/FVC ratio: 81% Interpretation: Spirometry consistent with possible restrictive disease.  Please see scanned spirometry results for details.  Skin Testing: Select foods. Negative milk and casein. Results discussed with patient/family.  Food Adult Perc - 04/02/22 1000     Time Antigen Placed 1030    Allergen Manufacturer Lavella Hammock    Location Back    Number of allergen test 4     Control-buffer 50% Glycerol Negative    Control-Histamine 1 mg/ml 3+    5. Milk, cow Negative    7. Casein Negative             Past Medical History: Patient Active Problem List   Diagnosis Date Noted   Other adverse food reactions, not elsewhere classified, subsequent encounter 04/02/2022   Other allergic rhinitis 04/02/2022   Wheezing 04/02/2022   Diverticulitis 02/23/2022   PVC's (premature ventricular contractions) 02/23/2022   Hypertension 02/23/2022   S/P arthroscopy of shoulder 02/03/2014   Endometriosis of ovary 07/10/2011   Past Medical History:  Diagnosis Date   Adhesive capsulitis of left shoulder    Diverticulitis    GERD (gastroesophageal reflux disease)    Headache(784.0)    Migraines   History of endometriosis    left ovary--  s/p  lso  2012   Hypertension    Labral tear of shoulder    LEFT   Migraines    OA (osteoarthritis) of knee    RIGHT   PVC's (premature ventricular contractions)    RLS (restless legs syndrome)    Wears glasses    Past Surgical History: Past Surgical History:  Procedure Laterality Date   FOOT SURGERY  child   FBO-removed needle left foot   KNEE  ARTHROSCOPY WITH ANTERIOR CRUCIATE LIGAMENT (ACL) REPAIR Right 2013   KNEE ARTHROSCOPY WITH MEDIAL MENISECTOMY Right 12/16/2013   Procedure: I & D Mass with excisional biopsy right knee;  Surgeon: Sydnee Cabal, MD;  Location: [facility];  Service: Orthopedics;  Laterality: Right;   LAPAROSCOPIC CHOLECYSTECTOMY  2008    [facility]   ROBOT ASSISTED LEFT SALPINGOOPHORECTOMY /  RIGHT SALPINGECTOMY  06-11-2011   SHOULDER ARTHROSCOPY W/ LABRAL REPAIR Right 2004   [name]   SHOULDER ARTHROSCOPY WITH SUBACROMIAL DECOMPRESSION, ROTATOR CUFF REPAIR AND BICEP TENDON REPAIR Left 02/03/2014   Procedure: LEFT SHOULDER ARTHROSCOPY WITH MANIPULATION UNDER ANESTHESIA/RELEASES SUBACROMIAL DECOMPRESSION/DISTAL CLAVICLE RESECTION/LABRAL DEBRIDEMENT.;  Surgeon: Sydnee Cabal, MD;  Location: [facility];  Service: Orthopedics;  Laterality: Left;  ANESTHESIA:  GENERAL/INTERSCALENE BLOCK   Medication List:  Current Outpatient Medications  Medication Sig Dispense Refill   acebutolol (SECTRAL) 200 MG capsule Take 200 mg by mouth every morning.      acetaminophen (TYLENOL) 325 MG tablet Take 650 mg by mouth daily as needed for mild pain.     albuterol (VENTOLIN HFA) 108 (90 Base) MCG/ACT inhaler Inhale 2 puffs into the lungs every 6 (six) hours as needed for wheezing or shortness of breath.     EPINEPHrine 0.3 mg/0.3 mL  IJ SOAJ injection Inject 0.3 mg into the muscle as needed for anaphylaxis.     fluticasone (FLONASE) 50 MCG/ACT nasal spray Place 1 spray into both nostrils daily.     ipratropium (ATROVENT) 0.06 % nasal spray Place 1 spray into both nostrils daily.     lisinopril (ZESTRIL) 10 MG tablet Take 10 mg by mouth daily.     Multiple Vitamins-Minerals (CENTRUM SILVER 50+WOMEN PO) Take 1 tablet by mouth daily.     omeprazole (PRILOSEC) 40 MG capsule Take 40 mg by mouth daily.     ondansetron (ZOFRAN) 4 MG tablet Take 1 tablet (4 mg total) by mouth daily as needed  for nausea or vomiting. 20 tablet 1   rizatriptan (MAXALT-MLT) 10 MG disintegrating tablet Take 10 mg by mouth See admin instructions. 10 mg PRN migraine, may repeat 10 mg in 2 hours if needed     No current facility-administered medications for this visit.   Allergies: Allergies  Allergen Reactions   Amoxil [Amoxicillin] Nausea And Vomiting   Augmentin [Amoxicillin-Pot Clavulanate] Nausea And Vomiting   Azithromycin Swelling   Erythromycin Swelling   Flagyl [Metronidazole] Nausea And Vomiting   Lactaid [Tilactase] Diarrhea   Lactose Diarrhea, Itching and Nausea And Vomiting   Adhesive [Tape] Rash   Latex Rash   Sulfa Antibiotics Rash   Social History: Social History   Socioeconomic History   Marital status: Married    Spouse name: Not on file   Number of children: Not on file   Years of education: Not on file   Highest education level: Not on file  Occupational History   Not on file  Tobacco Use   Smoking status: Never   Smokeless tobacco: Never  Substance and Sexual Activity   Alcohol use: Not Currently    Alcohol/week: 1.0 standard drink of alcohol    Types: 1 drink(s) per week    Comment: occasionally    Drug use: No   Sexual activity: Yes    Comment: Spouse with Vasectomy  Other Topics Concern   Not on file  Social History Narrative   Not on file   Social Determinants of Health   Financial Resource Strain: Not on file  Food Insecurity: Not on file  Transportation Needs: Not on file  Physical Activity: Not on file  Stress: Not on file  Social Connections: Not on file   Lives in a house. Smoking: denies Occupation: Geophysicist/field seismologist HistoryFreight forwarder in the house: no Charity fundraiser in the family room: no Carpet in the bedroom: no Heating: heat pump Cooling: central Pet: yes 1 dog indoors, 1 cat outdoors  Family History: Family History  Problem Relation Age of Onset   Anesthesia problems Sister    Diverticulosis Neg Hx    Problem                                Relation Asthma                                   Mother, siblings Eczema                                Son Food allergy  Son Allergic rhinitis   Siblings   Review of Systems  Constitutional:  Negative for appetite change, chills, fever and unexpected weight change.  HENT:  Negative for congestion and rhinorrhea.   Eyes:  Negative for itching.  Respiratory:  Positive for wheezing. Negative for cough, chest tightness and shortness of breath.   Cardiovascular:  Negative for chest pain.  Gastrointestinal:  Positive for abdominal pain, diarrhea, nausea and vomiting.  Genitourinary:  Negative for difficulty urinating.  Skin:  Negative for rash.  Neurological:  Negative for headaches.    Objective: BP 120/80   Pulse 78   Temp 98.1 F (36.7 C) (Temporal)   Resp 18   Ht '5\' 10"'$  (1.778 m)   Wt 192 lb (87.1 kg)   LMP 01/07/2014   SpO2 98%   BMI 27.55 kg/m  Body mass index is 27.55 kg/m. Physical Exam Vitals and nursing note reviewed.  Constitutional:      Appearance: Normal appearance. She is well-developed.  HENT:     Head: Normocephalic and atraumatic.     Right Ear: Tympanic membrane and external ear normal.     Left Ear: Tympanic membrane and external ear normal.     Nose: Nose normal.     Mouth/Throat:     Mouth: Mucous membranes are moist.     Pharynx: Oropharynx is clear.  Eyes:     Conjunctiva/sclera: Conjunctivae normal.  Cardiovascular:     Rate and Rhythm: Normal rate and regular rhythm.     Heart sounds: Normal heart sounds. No murmur heard.    No friction rub. No gallop.  Pulmonary:     Effort: Pulmonary effort is normal.     Breath sounds: Normal breath sounds. No wheezing, rhonchi or rales.  Musculoskeletal:     Cervical back: Neck supple.  Skin:    General: Skin is warm.     Findings: No rash.  Neurological:     Mental Status: She is alert and oriented to person, place, and time.  Psychiatric:         Behavior: Behavior normal.   The plan was reviewed with the patient/family, and all questions/concerned were addressed.  It was my pleasure to see Carol Hamilton today and participate in her care. Please feel free to contact me with any questions or concerns.  Sincerely,  Rexene Alberts, DO Allergy & Immunology  [facility] office: [place] office: [phone]

## 2022-04-02 ENCOUNTER — Ambulatory Visit: Payer: BC Managed Care – PPO | Admitting: Allergy

## 2022-04-02 ENCOUNTER — Encounter: Payer: Self-pay | Admitting: Allergy

## 2022-04-02 VITALS — BP 120/80 | HR 78 | Temp 98.1°F | Resp 18 | Ht 70.0 in | Wt 192.0 lb

## 2022-04-02 DIAGNOSIS — J3089 Other allergic rhinitis: Secondary | ICD-10-CM | POA: Insufficient documentation

## 2022-04-02 DIAGNOSIS — T781XXA Other adverse food reactions, not elsewhere classified, initial encounter: Secondary | ICD-10-CM | POA: Diagnosis not present

## 2022-04-02 DIAGNOSIS — T781XXD Other adverse food reactions, not elsewhere classified, subsequent encounter: Secondary | ICD-10-CM | POA: Insufficient documentation

## 2022-04-02 DIAGNOSIS — R062 Wheezing: Secondary | ICD-10-CM | POA: Diagnosis not present

## 2022-04-02 NOTE — Assessment & Plan Note (Signed)
Some symptoms in the fall.  . Use over the counter antihistamines such as Zyrtec (cetirizine), Claritin (loratadine), Allegra (fexofenadine), or Xyzal (levocetirizine) daily as needed. May take twice a day during allergy flares. May switch antihistamines every few months. . If symptoms worsen then recommend testing - declined today.

## 2022-04-02 NOTE — Patient Instructions (Addendum)
Today's skin testing showed: Negative milk and casein.  Results given.  Food allergies Start strict avoidance of dairy. Epinephrine injectable device and demonstrated proper use. For mild symptoms you can take over the counter antihistamines such as Benadryl and monitor symptoms closely. If symptoms worsen or if you have severe symptoms including breathing issues, throat closure, significant swelling, whole body hives, severe diarrhea and vomiting, lightheadedness then inject epinephrine and seek immediate medical care afterwards. Emergency action plan given. Get bloodwork We are ordering labs, so please allow 1-2 weeks for the results to come back. With the newly implemented Cures Act, the labs might be visible to you at the same time that they become visible to me. However, I will not address the results until all of the results are back, so please be patient.  In the meantime, continue recommendations in your patient instructions, including avoidance measures (if applicable), until you hear from me.  Breathing Breathing test showed some mild restriction - most likely due to body habitus. May use albuterol rescue inhaler 2 puffs 4 to 6 hours as needed for shortness of breath, chest tightness, coughing, and wheezing. Monitor frequency of use.   Environmental allergies Use over the counter antihistamines such as Zyrtec (cetirizine), Claritin (loratadine), Allegra (fexofenadine), or Xyzal (levocetirizine) daily as needed. May take twice a day during allergy flares. May switch antihistamines every few months. If symptoms worsen, then recommend allergy testing next.   Follow up in 6 months or sooner if needed.

## 2022-04-02 NOTE — Assessment & Plan Note (Signed)
No prior history of asthma but used albuterol after above incident for about 1 week with good benefit. . Today's spirometry showed some mild restriction - most likely due to body habitus. . May use albuterol rescue inhaler 2 puffs 4 to 6 hours as needed for shortness of breath, chest tightness, coughing, and wheezing. Monitor frequency of use.

## 2022-04-02 NOTE — Assessment & Plan Note (Signed)
Avoiding dairy for many years due to GI symptoms but recently may had cross contamination leading to vomiting, diarrhea, diaphoresis and feeling lightheaded/confused with some wheezing. No prior work up. No recent tick bites. Minimal red meat exposure.  Today's skin testing showed: Negative milk and casein.  Food allergen skin testing has excellent negative predictive value however there is still a small chance that the allergy exists. Therefore, we will investigate further with serum specific IgE levels.  Continue strict avoidance of dairy.  For mild symptoms you can take over the counter antihistamines such as Benadryl and monitor symptoms closely. If symptoms worsen or if you have severe symptoms including breathing issues, throat closure, significant swelling, whole body hives, severe diarrhea and vomiting, lightheadedness then inject epinephrine and seek immediate medical care afterwards.  Emergency action plan given.  Get bloodwork.

## 2022-04-07 LAB — ALPHA-GAL PANEL
Allergen Lamb IgE: <0.1 kU/L
Beef IgE: <0.1 kU/L
IgE (Immunoglobulin E), Serum: 10 IU/mL (ref 6–495)
O215-IgE Alpha-Gal: <0.1 kU/L
Pork IgE: <0.1 kU/L

## 2022-04-07 LAB — IGE MILK W/ COMPONENT REFLEX: F002-IgE Milk: <0.1 kU/L

## 2022-04-08 ENCOUNTER — Ambulatory Visit (HOSPITAL_COMMUNITY)
Admission: RE | Admit: 2022-04-08 | Discharge: 2022-04-08 | Disposition: A | Discharge status: Home / Self Care | Payer: BC Managed Care – PPO | Source: Ambulatory Visit | Attending: Family Medicine | Admitting: Family Medicine

## 2022-04-08 DIAGNOSIS — R0989 Other specified symptoms and signs involving the circulatory and respiratory systems: Secondary | ICD-10-CM | POA: Insufficient documentation

## 2022-04-08 DIAGNOSIS — R1310 Dysphagia, unspecified: Secondary | ICD-10-CM

## 2022-04-08 DIAGNOSIS — I10 Essential (primary) hypertension: Secondary | ICD-10-CM | POA: Insufficient documentation

## 2022-04-08 DIAGNOSIS — G2581 Restless legs syndrome: Secondary | ICD-10-CM | POA: Insufficient documentation

## 2022-09-18 ENCOUNTER — Other Ambulatory Visit: Payer: Self-pay | Admitting: Gastroenterology

## 2022-09-18 DIAGNOSIS — R109 Unspecified abdominal pain: Secondary | ICD-10-CM

## 2022-09-24 ENCOUNTER — Inpatient Hospital Stay: Admission: RE | Admit: 2022-09-24 | Payer: BC Managed Care – PPO | Source: Ambulatory Visit

## 2022-10-03 ENCOUNTER — Ambulatory Visit: Payer: BC Managed Care – PPO | Admitting: Allergy

## 2023-01-16 ENCOUNTER — Emergency Department (HOSPITAL_COMMUNITY): Payer: BC Managed Care – PPO

## 2023-01-16 ENCOUNTER — Other Ambulatory Visit: Payer: Self-pay

## 2023-01-16 ENCOUNTER — Inpatient Hospital Stay (HOSPITAL_COMMUNITY)
Admission: EM | Admit: 2023-01-16 | Discharge: 2023-01-19 | DRG: 392 | Disposition: A | Discharge status: Home / Self Care | Payer: BC Managed Care – PPO | Attending: Internal Medicine | Admitting: Internal Medicine

## 2023-01-16 ENCOUNTER — Encounter (HOSPITAL_COMMUNITY): Payer: Self-pay | Admitting: *Deleted

## 2023-01-16 DIAGNOSIS — K219 Gastro-esophageal reflux disease without esophagitis: Secondary | ICD-10-CM | POA: Insufficient documentation

## 2023-01-16 DIAGNOSIS — R7401 Elevation of levels of liver transaminase levels: Secondary | ICD-10-CM | POA: Diagnosis present

## 2023-01-16 DIAGNOSIS — Z880 Allergy status to penicillin: Secondary | ICD-10-CM

## 2023-01-16 DIAGNOSIS — K5792 Diverticulitis of intestine, part unspecified, without perforation or abscess without bleeding: Secondary | ICD-10-CM | POA: Diagnosis present

## 2023-01-16 DIAGNOSIS — K760 Fatty (change of) liver, not elsewhere classified: Secondary | ICD-10-CM | POA: Diagnosis present

## 2023-01-16 DIAGNOSIS — K838 Other specified diseases of biliary tract: Secondary | ICD-10-CM | POA: Diagnosis present

## 2023-01-16 DIAGNOSIS — Z883 Allergy status to other anti-infective agents status: Secondary | ICD-10-CM

## 2023-01-16 DIAGNOSIS — I10 Essential (primary) hypertension: Secondary | ICD-10-CM | POA: Diagnosis present

## 2023-01-16 DIAGNOSIS — G43909 Migraine, unspecified, not intractable, without status migrainosus: Secondary | ICD-10-CM | POA: Insufficient documentation

## 2023-01-16 DIAGNOSIS — K5732 Diverticulitis of large intestine without perforation or abscess without bleeding: Secondary | ICD-10-CM | POA: Diagnosis not present

## 2023-01-16 DIAGNOSIS — Z79899 Other long term (current) drug therapy: Secondary | ICD-10-CM

## 2023-01-16 DIAGNOSIS — Z881 Allergy status to other antibiotic agents status: Secondary | ICD-10-CM

## 2023-01-16 DIAGNOSIS — Z91011 Allergy to milk products: Secondary | ICD-10-CM

## 2023-01-16 DIAGNOSIS — Z91040 Latex allergy status: Secondary | ICD-10-CM

## 2023-01-16 DIAGNOSIS — Z9049 Acquired absence of other specified parts of digestive tract: Secondary | ICD-10-CM

## 2023-01-16 DIAGNOSIS — Z882 Allergy status to sulfonamides status: Secondary | ICD-10-CM

## 2023-01-16 DIAGNOSIS — G2581 Restless legs syndrome: Secondary | ICD-10-CM | POA: Diagnosis present

## 2023-01-16 LAB — URINALYSIS, ROUTINE W REFLEX MICROSCOPIC
Bilirubin Urine: NEGATIVE
Glucose, UA: NEGATIVE mg/dL
Hgb urine dipstick: NEGATIVE
Ketones, ur: NEGATIVE mg/dL
Leukocytes,Ua: NEGATIVE
Nitrite: NEGATIVE
Protein, ur: NEGATIVE mg/dL
Specific Gravity, Urine: 1.004 — ABNORMAL LOW (ref 1.005–1.030)
pH: 8 (ref 5.0–8.0)

## 2023-01-16 LAB — CBC WITH DIFFERENTIAL/PLATELET
Abs Immature Granulocytes: 0.05 10*3/uL (ref 0.00–0.07)
Basophils Absolute: 0 10*3/uL (ref 0.0–0.1)
Basophils Relative: 0 %
Eosinophils Absolute: 0.1 10*3/uL (ref 0.0–0.5)
Eosinophils Relative: 1 %
HCT: 39.1 % (ref 36.0–46.0)
Hemoglobin: 13.2 g/dL (ref 12.0–15.0)
Immature Granulocytes: 0 %
Lymphocytes Relative: 14 %
Lymphs Abs: 1.8 10*3/uL (ref 0.7–4.0)
MCH: 30.8 pg (ref 26.0–34.0)
MCHC: 33.8 g/dL (ref 30.0–36.0)
MCV: 91.4 fL (ref 80.0–100.0)
Monocytes Absolute: 0.9 10*3/uL (ref 0.1–1.0)
Monocytes Relative: 7 %
Neutro Abs: 10.7 10*3/uL — ABNORMAL HIGH (ref 1.7–7.7)
Neutrophils Relative %: 78 %
Platelets: 277 10*3/uL (ref 150–400)
RBC: 4.28 MIL/uL (ref 3.87–5.11)
RDW: 12.5 % (ref 11.5–15.5)
WBC: 13.5 10*3/uL — ABNORMAL HIGH (ref 4.0–10.5)
nRBC: 0 % (ref 0.0–0.2)

## 2023-01-16 LAB — COMPREHENSIVE METABOLIC PANEL
ALT: 21 U/L (ref 0–44)
AST: 20 U/L (ref 15–41)
Albumin: 4.2 g/dL (ref 3.5–5.0)
Alkaline Phosphatase: 78 U/L (ref 38–126)
Anion gap: 14 (ref 5–15)
BUN: 8 mg/dL (ref 6–20)
CO2: 22 mmol/L (ref 22–32)
Calcium: 9.6 mg/dL (ref 8.9–10.3)
Chloride: 102 mmol/L (ref 98–111)
Creatinine, Ser: 0.82 mg/dL (ref 0.44–1.00)
GFR, Estimated: >60 mL/min (ref >60)
Glucose, Bld: 142 mg/dL — ABNORMAL HIGH (ref 70–99)
Potassium: 3.6 mmol/L (ref 3.5–5.1)
Sodium: 138 mmol/L (ref 135–145)
Total Bilirubin: 1.3 mg/dL — ABNORMAL HIGH (ref 0.3–1.2)
Total Protein: 7.3 g/dL (ref 6.5–8.1)

## 2023-01-16 LAB — I-STAT CHEM 8, ED
BUN: 8 mg/dL (ref 6–20)
Calcium, Ion: 1.14 mmol/L — ABNORMAL LOW (ref 1.15–1.40)
Chloride: 105 mmol/L (ref 98–111)
Creatinine, Ser: 0.7 mg/dL (ref 0.44–1.00)
Glucose, Bld: 132 mg/dL — ABNORMAL HIGH (ref 70–99)
HCT: 33 % — ABNORMAL LOW (ref 36.0–46.0)
Hemoglobin: 11.2 g/dL — ABNORMAL LOW (ref 12.0–15.0)
Potassium: 3.7 mmol/L (ref 3.5–5.1)
Sodium: 138 mmol/L (ref 135–145)
TCO2: 24 mmol/L (ref 22–32)

## 2023-01-16 LAB — TROPONIN I (HIGH SENSITIVITY)
Troponin I (High Sensitivity): 5 ng/L (ref <18)
Troponin I (High Sensitivity): 4 ng/L (ref <18)

## 2023-01-16 LAB — LIPASE, BLOOD: Lipase: 35 U/L (ref 11–51)

## 2023-01-16 MED ORDER — ONDANSETRON HCL 4 MG/2ML IJ SOLN
4.0000 mg | Freq: Once | INTRAMUSCULAR | Status: AC
  Administered 2023-01-16: 4 mg via INTRAVENOUS
  Filled 2023-01-16: qty 2

## 2023-01-16 MED ORDER — SODIUM CHLORIDE 0.9 % IV SOLN: 2.0000 g | Freq: Every day | INTRAVENOUS | Status: DC

## 2023-01-16 MED ORDER — ACETAMINOPHEN 650 MG RE SUPP: 650.0000 mg | Freq: Four times a day (QID) | RECTAL | Status: DC | PRN

## 2023-01-16 MED ORDER — RIZATRIPTAN BENZOATE 10 MG PO TBDP: 10.0000 mg | ORAL_TABLET | ORAL | Status: DC

## 2023-01-16 MED ORDER — HYDROMORPHONE HCL 1 MG/ML IJ SOLN
0.5000 mg | INTRAMUSCULAR | Status: DC | PRN
  Administered 2023-01-16: 1 mg via INTRAVENOUS
  Administered 2023-01-16: 0.5 mg via INTRAVENOUS
  Administered 2023-01-17 (×3): 1 mg via INTRAVENOUS
  Filled 2023-01-16 (×6): qty 1

## 2023-01-16 MED ORDER — ALBUTEROL SULFATE (2.5 MG/3ML) 0.083% IN NEBU: 2.5000 mg | INHALATION_SOLUTION | Freq: Four times a day (QID) | RESPIRATORY_TRACT | Status: DC | PRN

## 2023-01-16 MED ORDER — SODIUM CHLORIDE 0.9% FLUSH
3.0000 mL | Freq: Two times a day (BID) | INTRAVENOUS | Status: DC
  Administered 2023-01-16 – 2023-01-19 (×6): 3 mL via INTRAVENOUS

## 2023-01-16 MED ORDER — ENOXAPARIN SODIUM 40 MG/0.4ML IJ SOSY: 40.0000 mg | PREFILLED_SYRINGE | INTRAMUSCULAR | Status: DC

## 2023-01-16 MED ORDER — METRONIDAZOLE 500 MG/100ML IV SOLN: 500.0000 mg | Freq: Two times a day (BID) | INTRAVENOUS | Status: DC

## 2023-01-16 MED ORDER — LISINOPRIL 20 MG PO TABS
10.0000 mg | ORAL_TABLET | Freq: Every day | ORAL | Status: DC
  Administered 2023-01-18 – 2023-01-19 (×2): 10 mg via ORAL
  Filled 2023-01-16 (×3): qty 1

## 2023-01-16 MED ORDER — PIPERACILLIN-TAZOBACTAM 3.375 G IVPB
3.3750 g | Freq: Three times a day (TID) | INTRAVENOUS | Status: DC
  Administered 2023-01-16 – 2023-01-19 (×9): 3.375 g via INTRAVENOUS
  Filled 2023-01-16 (×9): qty 50

## 2023-01-16 MED ORDER — IOHEXOL 350 MG/ML SOLN
75.0000 mL | Freq: Once | INTRAVENOUS | Status: AC | PRN
  Administered 2023-01-16: 75 mL via INTRAVENOUS

## 2023-01-16 MED ORDER — SODIUM CHLORIDE 0.9 % IV SOLN: 3.0000 g | Freq: Once | INTRAVENOUS | Status: DC

## 2023-01-16 MED ORDER — MORPHINE SULFATE (PF) 4 MG/ML IV SOLN
4.0000 mg | Freq: Once | INTRAVENOUS | Status: AC
  Administered 2023-01-16: 4 mg via INTRAVENOUS
  Filled 2023-01-16: qty 1

## 2023-01-16 MED ORDER — ACEBUTOLOL HCL 200 MG PO CAPS
200.0000 mg | ORAL_CAPSULE | Freq: Every morning | ORAL | Status: DC
  Administered 2023-01-18 – 2023-01-19 (×2): 200 mg via ORAL
  Filled 2023-01-16 (×3): qty 1

## 2023-01-16 MED ORDER — PANTOPRAZOLE SODIUM 40 MG PO TBEC
40.0000 mg | DELAYED_RELEASE_TABLET | Freq: Every day | ORAL | Status: DC
  Administered 2023-01-17 – 2023-01-19 (×3): 40 mg via ORAL
  Filled 2023-01-16 (×3): qty 1

## 2023-01-16 MED ORDER — SUMATRIPTAN SUCCINATE 50 MG PO TABS: 50.0000 mg | ORAL_TABLET | ORAL | Status: DC | PRN

## 2023-01-16 MED ORDER — SODIUM CHLORIDE 0.9 % IV SOLN: INTRAVENOUS | Status: AC

## 2023-01-16 MED ORDER — ACETAMINOPHEN 325 MG PO TABS
650.0000 mg | ORAL_TABLET | Freq: Four times a day (QID) | ORAL | Status: DC | PRN
  Administered 2023-01-17 – 2023-01-19 (×4): 650 mg via ORAL
  Filled 2023-01-16 (×4): qty 2

## 2023-01-16 MED ORDER — SODIUM CHLORIDE 0.9 % IV BOLUS
500.0000 mL | Freq: Once | INTRAVENOUS | Status: AC
  Administered 2023-01-16: 500 mL via INTRAVENOUS

## 2023-01-16 MED ORDER — ONDANSETRON HCL 4 MG/2ML IJ SOLN
4.0000 mg | Freq: Four times a day (QID) | INTRAMUSCULAR | Status: DC | PRN
  Administered 2023-01-16 – 2023-01-17 (×2): 4 mg via INTRAVENOUS
  Filled 2023-01-16 (×2): qty 2

## 2023-01-16 NOTE — Progress Notes (Signed)
Pharmacy Antibiotic Note  Carol Hamilton is a 57 y.o. female admitted on 01/16/2023 with  intra-abdominal infection .  Pharmacy has been consulted for Zosyn dosing.  Plan: Zosyn 3.375g IV q8h (4 hour infusion). Follow culture data for de-escalation.  Monitor renal function for dose adjustments as indicated.   Height: 5\' 10"  (177.8 cm) Weight: 88.5 kg (195 lb) IBW/kg (Calculated) : 68.5  Temp (24hrs), Avg:99.3 F (37.4 C), Min:99.2 F (37.3 C), Max:99.4 F (37.4 C)  Recent Labs  Lab 01/16/23 0921 01/16/23 0948  WBC 13.5*  --   CREATININE 0.82 0.70    Estimated Creatinine Clearance: 91.4 mL/min (by C-G formula based on SCr of 0.7 mg/dL).    Allergies  Allergen Reactions   Amoxil [Amoxicillin] Nausea And Vomiting   Augmentin [Amoxicillin-Pot Clavulanate] Nausea And Vomiting   Azithromycin Swelling   Erythromycin Swelling   Flagyl [Metronidazole] Nausea And Vomiting   Lactaid [Tilactase] Diarrhea   Lactose Diarrhea, Itching and Nausea And Vomiting   Adhesive [Tape] Rash   Latex Rash   Sulfa Antibiotics Rash   Thank you for allowing pharmacy to be a part of this patient's care.  Estill Batten, PharmD, BCCCP  01/16/2023 1:54 PM

## 2023-01-16 NOTE — ED Notes (Signed)
Back to room by w/c, "needing to go by b/r first", attempting urine sample, triage delayed.

## 2023-01-16 NOTE — ED Notes (Signed)
ED TO INPATIENT HANDOFF REPORT  ED Nurse Name and Phone #: Lenell Antu Name/Age/Gender Carol Hamilton 57 y.o. female Room/Bed: 010C/010C  Code Status   Code Status: Full Code  Home/SNF/Other Home Patient oriented to: self, place, time, and situation Is this baseline? Yes   Triage Complete: Triage complete  Chief Complaint Acute diverticulitis [K57.92]  Triage Note Here by POV from home for recurrent LLQ pain, h/o diverticulitis, "feels the same", recent monotherapy ABT x2, finished augmentin, then 2 weeks ago finished doxycycline. C/o LLQ pain, chills, low grade fever "100.4", nausea, and bladder spasms and pressure. Sx onset last night. Describes as frequent/ recurrent. Denies bleeding, diarrhea, syncope, sob. See allergies. No meds taken in last 8 hrs.    Allergies Allergies  Allergen Reactions   Amoxil [Amoxicillin] Nausea And Vomiting   Augmentin [Amoxicillin-Pot Clavulanate] Nausea And Vomiting   Azithromycin Swelling   Erythromycin Swelling   Flagyl [Metronidazole] Nausea And Vomiting   Lactaid [Tilactase] Diarrhea   Lactose Diarrhea, Itching and Nausea And Vomiting   Adhesive [Tape] Rash   Latex Rash   Sulfa Antibiotics Rash    Level of Care/Admitting Diagnosis ED Disposition     ED Disposition  Admit   Condition  --   Comment  Hospital Area: [facility] [[id number]]  Level of Care: Med-Surg [16]  May place patient in observation at [facility] or [facility] if equivalent level of care is available:: No  Covid Evaluation: Asymptomatic - no recent exposure (last 10 days) testing not required  Diagnosis: Acute diverticulitis [1610960]  Admitting Physician: Synetta Fail [[id number]]  Attending Physician: Synetta Fail [[id number]]          B Medical/Surgery History Past Medical History:  Diagnosis Date   Adhesive capsulitis of left shoulder    Diverticulitis    GERD (gastroesophageal reflux disease)    Headache(784.0)     Migraines   History of endometriosis    left ovary--  s/p  lso  2012   Hypertension    Labral tear of shoulder    LEFT   Migraines    OA (osteoarthritis) of knee    RIGHT   PVC's (premature ventricular contractions)    RLS (restless legs syndrome)    Wears glasses    Past Surgical History:  Procedure Laterality Date   FOOT SURGERY  child   FBO-removed needle left foot   KNEE ARTHROSCOPY WITH ANTERIOR CRUCIATE LIGAMENT (ACL) REPAIR Right 2013   KNEE ARTHROSCOPY WITH MEDIAL MENISECTOMY Right 12/16/2013   Procedure: I & D Mass with excisional biopsy right knee;  Surgeon: Eugenia Mcalpine, MD;  Location: [facility] [place];  Service: Orthopedics;  Laterality: Right;   LAPAROSCOPIC CHOLECYSTECTOMY  2008    [facility]   ROBOT ASSISTED LEFT SALPINGOOPHORECTOMY /  RIGHT SALPINGECTOMY  06-11-2011   SHOULDER ARTHROSCOPY W/ LABRAL REPAIR Right 2004   [name]   SHOULDER ARTHROSCOPY WITH SUBACROMIAL DECOMPRESSION, ROTATOR CUFF REPAIR AND BICEP TENDON REPAIR Left 02/03/2014   Procedure: LEFT SHOULDER ARTHROSCOPY WITH MANIPULATION UNDER ANESTHESIA/RELEASES SUBACROMIAL DECOMPRESSION/DISTAL CLAVICLE RESECTION/LABRAL DEBRIDEMENT.;  Surgeon: Eugenia Mcalpine, MD;  Location: [facility];  Service: Orthopedics;  Laterality: Left;  ANESTHESIA:  GENERAL/INTERSCALENE BLOCK     A IV Location/Drains/Wounds Patient Lines/Drains/Airways Status     Active Line/Drains/Airways     Name Placement date Placement time Site Days   Peripheral IV 01/16/23 20 G Right;Lateral Antecubital 01/16/23  0916  Antecubital  less than 1   Open Drain 1 Right  Knee 12/16/13  1556  Knee  3318   Incision - 4 Ports Abdomen 1: Right;Lateral 2: Superior;Umbilicus 3: Upper;Left Lower;Left 06/11/11  --  -- 4237            Intake/Output Last 24 hours No intake or output data in the 24 hours ending 01/16/23 1345  Labs/Imaging Results for orders placed or performed during the hospital  encounter of 01/16/23 (from the past 48 hour(s))  Urinalysis, Routine w reflex microscopic -Urine, Clean Catch     Status: Abnormal   Collection Time: 01/16/23  9:12 AM  Result Value Ref Range   Color, Urine STRAW (A) YELLOW   APPearance CLEAR CLEAR   Specific Gravity, Urine 1.004 (L) 1.005 - 1.030   pH 8.0 5.0 - 8.0   Glucose, UA NEGATIVE NEGATIVE mg/dL   Hgb urine dipstick NEGATIVE NEGATIVE   Bilirubin Urine NEGATIVE NEGATIVE   Ketones, ur NEGATIVE NEGATIVE mg/dL   Protein, ur NEGATIVE NEGATIVE mg/dL   Nitrite NEGATIVE NEGATIVE   Leukocytes,Ua NEGATIVE NEGATIVE    Comment: Performed at [facility], [address] [place], [place] [address]  Lipase, blood     Status: None   Collection Time: 01/16/23  9:21 AM  Result Value Ref Range   Lipase 35 11 - 51 U/L    Comment: Performed at [facility], [address], [place], [place] [address]  Comprehensive metabolic panel     Status: Abnormal   Collection Time: 01/16/23  9:21 AM  Result Value Ref Range   Sodium 138 135 - 145 mmol/L   Potassium 3.6 3.5 - 5.1 mmol/L   Chloride 102 98 - 111 mmol/L   CO2 22 22 - 32 mmol/L   Glucose, Bld 142 (H) 70 - 99 mg/dL    Comment: Glucose reference range applies only to samples taken after fasting for at least 8 hours.   BUN 8 6 - 20 mg/dL   Creatinine, Ser 0.98 0.44 - 1.00 mg/dL   Calcium 9.6 8.9 - 11.9 mg/dL   Total Protein 7.3 6.5 - 8.1 g/dL   Albumin 4.2 3.5 - 5.0 g/dL   AST 20 15 - 41 U/L   ALT 21 0 - 44 U/L   Alkaline Phosphatase 78 38 - 126 U/L   Total Bilirubin 1.3 (H) 0.3 - 1.2 mg/dL   GFR, Estimated >14 >78 mL/min    Comment: (NOTE) Calculated using the CKD-EPI Creatinine Equation (2021)    Anion gap 14 5 - 15    Comment: Performed at [facility], [address] [place], [place] [address]  CBC with Differential     Status: Abnormal   Collection Time: 01/16/23  9:21 AM  Result Value Ref Range   WBC 13.5 (H) 4.0 - 10.5 K/uL   RBC 4.28 3.87 - 5.11 MIL/uL    Hemoglobin 13.2 12.0 - 15.0 g/dL   HCT 13.0 86.5 - 78.4 %   MCV 91.4 80.0 - 100.0 fL   MCH 30.8 26.0 - 34.0 pg   MCHC 33.8 30.0 - 36.0 g/dL   RDW 69.6 29.5 - 28.4 %   Platelets 277 150 - 400 K/uL   nRBC 0.0 0.0 - 0.2 %   Neutrophils Relative % 78 %   Neutro Abs 10.7 (H) 1.7 - 7.7 K/uL   Lymphocytes Relative 14 %   Lymphs Abs 1.8 0.7 - 4.0 K/uL   Monocytes Relative 7 %   Monocytes Absolute 0.9 0.1 - 1.0 K/uL  Eosinophils Relative 1 %   Eosinophils Absolute 0.1 0.0 - 0.5 K/uL   Basophils Relative 0 %   Basophils Absolute 0.0 0.0 - 0.1 K/uL   Immature Granulocytes 0 %   Abs Immature Granulocytes 0.05 0.00 - 0.07 K/uL    Comment: Performed at [facility], [address], [place], [place] [address]  Troponin I (High Sensitivity)     Status: None   Collection Time: 01/16/23  9:21 AM  Result Value Ref Range   Troponin I (High Sensitivity) 5 <18 ng/L    Comment: (NOTE) Elevated high sensitivity troponin I (hsTnI) values and significant  changes across serial measurements may suggest ACS but many other  chronic and acute conditions are known to elevate hsTnI results.  Refer to the "Links" section for chest pain algorithms and additional  guidance. Performed at [facility], [address], [place], [place] [address]   I-stat chem 8, ED     Status: Abnormal   Collection Time: 01/16/23  9:48 AM  Result Value Ref Range   Sodium 138 135 - 145 mmol/L   Potassium 3.7 3.5 - 5.1 mmol/L   Chloride 105 98 - 111 mmol/L   BUN 8 6 - 20 mg/dL   Creatinine, Ser 0.98 0.44 - 1.00 mg/dL   Glucose, Bld 119 (H) 70 - 99 mg/dL    Comment: Glucose reference range applies only to samples taken after fasting for at least 8 hours.   Calcium, Ion 1.14 (L) 1.15 - 1.40 mmol/L   TCO2 24 22 - 32 mmol/L   Hemoglobin 11.2 (L) 12.0 - 15.0 g/dL   HCT 14.7 (L) 82.9 - 56.2 %  Troponin I (High Sensitivity)     Status: None   Collection Time: 01/16/23 12:25 PM  Result Value Ref Range   Troponin I  (High Sensitivity) 4 <18 ng/L    Comment: (NOTE) Elevated high sensitivity troponin I (hsTnI) values and significant  changes across serial measurements may suggest ACS but many other  chronic and acute conditions are known to elevate hsTnI results.  Refer to the "Links" section for chest pain algorithms and additional  guidance. Performed at [facility], [address], [place], [place] [address]    CT ABDOMEN PELVIS W CONTRAST  Result Date: 01/16/2023 CLINICAL DATA:  Left lower quadrant abdominal pain. History of diverticulitis. EXAM: CT ABDOMEN AND PELVIS WITH CONTRAST TECHNIQUE: Multidetector CT imaging of the abdomen and pelvis was performed using the standard protocol following bolus administration of intravenous contrast. RADIATION DOSE REDUCTION: This exam was performed according to the departmental dose-optimization program which includes automated exposure control, adjustment of the mA and/or kV according to patient size and/or use of iterative reconstruction technique. CONTRAST:  75mL OMNIPAQUE IOHEXOL 350 MG/ML SOLN COMPARISON:  03/28/2022 FINDINGS: Lower chest: The lung bases are clear of acute process. No pleural effusion or pulmonary lesions. The heart is normal in size. No pericardial effusion. The distal esophagus and aorta are unremarkable. Hepatobiliary: No hepatic lesions are identified. Slightly progressive intrahepatic and extrahepatic biliary dilatation likely related to prior cholecystectomy. Pancreas: Normal Spleen: Normal Adrenals/Urinary Tract: No significant findings. Stomach/Bowel: The stomach, duodenum, small, terminal ileum and appendix are normal. Severe acute diverticulitis at the descending colon sigmoid colon junction region with marked wall thickening and pericolonic inflammatory changes. No evidence of perforation or abscess. Severe sigmoid colon diverticulosis. Vascular/Lymphatic: The aorta is normal in caliber. No dissection. The branch vessels are patent.  The major venous structures are  patent. No mesenteric or retroperitoneal mass or adenopathy. Small scattered lymph nodes are noted. Reproductive: No significant findings. Other: No free air or free fluid. Musculoskeletal: No significant bony findings. IMPRESSION: 1. Severe acute diverticulitis at the descending colon sigmoid colon junction region. No evidence of perforation or abscess. 2. Slightly progressive intrahepatic and extrahepatic biliary dilatation likely related to prior cholecystectomy. Electronically Signed   By: Rudie Meyer M.D.   On: 01/16/2023 11:53   DG Chest 2 View  Result Date: 01/16/2023 CLINICAL DATA:  Provided history: Epigastric/lower chest pain. Nausea. EXAM: CHEST - 2 VIEW COMPARISON:  Prior chest radiographs 01/29/2022 and earlier. FINDINGS: Heart size within normal limits. No appreciable airspace consolidation. No evidence of pleural effusion or pneumothorax. Widened left acromioclavicular joint. Soft tissue anchors about the right humeral head. IMPRESSION: 1. No evidence of an acute cardiopulmonary abnormality. 2. Widened left acromioclavicular joint suggesting prior AC joint injury. Electronically Signed   By: Jackey Loge D.O.   On: 01/16/2023 11:52    Pending Labs Unresulted Labs (From admission, onward)     Start     Ordered   01/23/23 0500  Creatinine, serum  (enoxaparin (LOVENOX)    CrCl >/= 30 ml/min)  Weekly,   R     Comments: while on enoxaparin therapy    01/16/23 1334   01/17/23 0500  Comprehensive metabolic panel  Tomorrow morning,   R        01/16/23 1334   01/17/23 0500  CBC  Tomorrow morning,   R        01/16/23 1334            Vitals/Pain Today's Vitals   01/16/23 1030 01/16/23 1100 01/16/23 1230 01/16/23 1315  BP: 128/77 135/67    Pulse: 87 91    Resp: (!) 28 19    Temp:    99.4 F (37.4 C)  TempSrc:    Oral  SpO2: 100% 96%    Weight:      Height:      PainSc:   3      Isolation Precautions No active  isolations  Medications Medications  rizatriptan (MAXALT-MLT) disintegrating tablet 10 mg (has no administration in time range)  lisinopril (ZESTRIL) tablet 10 mg (has no administration in time range)  pantoprazole (PROTONIX) EC tablet 40 mg (has no administration in time range)  albuterol (VENTOLIN HFA) 108 (90 Base) MCG/ACT inhaler 2 puff (has no administration in time range)  enoxaparin (LOVENOX) injection 40 mg (has no administration in time range)  sodium chloride flush (NS) 0.9 % injection 3 mL (has no administration in time range)  0.9 %  sodium chloride infusion (has no administration in time range)  acetaminophen (TYLENOL) tablet 650 mg (has no administration in time range)    Or  acetaminophen (TYLENOL) suppository 650 mg (has no administration in time range)  HYDROmorphone (DILAUDID) injection 0.5-1 mg (has no administration in time range)  cefTRIAXone (ROCEPHIN) 2 g in sodium chloride 0.9 % 100 mL IVPB (has no administration in time range)  metroNIDAZOLE (FLAGYL) IVPB 500 mg (has no administration in time range)  morphine (PF) 4 MG/ML injection 4 mg (4 mg Intravenous Given 01/16/23 0939)  ondansetron (ZOFRAN) injection 4 mg (4 mg Intravenous Given 01/16/23 0938)  morphine (PF) 4 MG/ML injection 4 mg (4 mg Intravenous Given 01/16/23 1042)  iohexol (OMNIPAQUE) 350 MG/ML injection 75 mL (75 mLs Intravenous Contrast Given 01/16/23 1126)    Mobility walks     Focused Assessments  R Recommendations: See Admitting Provider Note  Report given to:   Additional Notes:

## 2023-01-16 NOTE — ED Notes (Signed)
Patient transported to CT 

## 2023-01-16 NOTE — ED Provider Notes (Signed)
[place] [facility] AT [facility] Provider Note   CSN: [id number] Arrival date & time: 01/16/23  8657     History  Chief Complaint  Patient presents with   Abdominal Pain    Carol Hamilton is a 57 y.o. female.  Patient is a 57 year old female with a past medical history of recurrent diverticulitis presenting to the emergency department with left lower quadrant abdominal pain.  She states that she was last treated for diverticulitis about a month ago and she would occasionally feel some lower abdominal pressure but states that her symptoms had mostly resolved.  She states that last night she started to feel increased pain in her left lower quadrant and had low-grade fever overnight.  She states when she woke up this morning she had significant increase of her pain with associated nausea.  She denies any vomiting.  She states that she also feels like she is having bladder spasms.  She denies any diarrhea or constipation.  She states that she does follow with GI regularly and was planned to see a surgeon in August due to her recurrent diverticulitis flares.  The history is provided by the patient.  Abdominal Pain      Home Medications Prior to Admission medications   Medication Sig Start Date End Date Taking? Authorizing Provider  acebutolol (SECTRAL) 200 MG capsule Take 200 mg by mouth every morning.     [provider]  acetaminophen (TYLENOL) 325 MG tablet Take 650 mg by mouth daily as needed for mild pain.    [provider]  albuterol (VENTOLIN HFA) 108 (90 Base) MCG/ACT inhaler Inhale 2 puffs into the lungs every 6 (six) hours as needed for wheezing or shortness of breath. 02/07/22   [provider]  EPINEPHrine 0.3 mg/0.3 mL IJ SOAJ injection Inject 0.3 mg into the muscle as needed for anaphylaxis. 02/07/22   [provider]  fluticasone (FLONASE) 50 MCG/ACT nasal spray Place 1 spray into both nostrils daily.    [provider]  ipratropium (ATROVENT) 0.06 % nasal spray Place 1 spray into both nostrils daily. 01/29/22   [provider]  lisinopril (ZESTRIL) 10 MG tablet Take 10 mg by mouth daily. 01/14/22   [provider]  Multiple Vitamins-Minerals (CENTRUM SILVER 50+WOMEN PO) Take 1 tablet by mouth daily.    [provider]  omeprazole (PRILOSEC) 40 MG capsule Take 40 mg by mouth daily.    [provider]  ondansetron (ZOFRAN) 4 MG tablet Take 1 tablet (4 mg total) by mouth daily as needed for nausea or vomiting. 02/27/22 02/27/23  Burnadette Pop, MD  rizatriptan (MAXALT-MLT) 10 MG disintegrating tablet Take 10 mg by mouth See admin instructions. 10 mg PRN migraine, may repeat 10 mg in 2 hours if needed    [provider]      Allergies    Amoxil [amoxicillin], Augmentin [amoxicillin-pot clavulanate], Azithromycin, Erythromycin, Flagyl [metronidazole], Lactaid [tilactase], Lactose, Adhesive [tape], Latex, and Sulfa antibiotics    Review of Systems   Review of Systems  Gastrointestinal:  Positive for abdominal pain.    Physical Exam Updated Vital Signs BP 135/67   Pulse 91   Temp 99.4 F (37.4 C) (Oral)   Resp 19   Ht 5\' 10"  (1.778 m)   Wt 88.5 kg   LMP 01/07/2014   SpO2 96%   BMI 27.98 kg/m  Physical Exam Vitals and nursing note reviewed.  Constitutional:      General: She is not in  acute distress.    Appearance: She is well-developed.  HENT:     Head: Normocephalic and atraumatic.     Mouth/Throat:     Mouth: Mucous membranes are moist.     Pharynx: Oropharynx is clear.  Eyes:     Extraocular Movements: Extraocular movements intact.  Cardiovascular:     Rate and Rhythm: Normal rate and regular rhythm.     Heart sounds: Normal heart sounds.  Pulmonary:     Effort: Pulmonary effort is normal.     Breath sounds: Normal breath sounds.  Abdominal:     General: Abdomen is flat.     Palpations: Abdomen is soft.     Tenderness: There is  abdominal tenderness in the left lower quadrant. There is no guarding or rebound.  Skin:    General: Skin is warm and dry.  Neurological:     General: No focal deficit present.     Mental Status: She is alert and oriented to person, place, and time.  Psychiatric:        Mood and Affect: Mood normal.        Behavior: Behavior normal.     ED Results / Procedures / Treatments   Labs (all labs ordered are listed, but only abnormal results are displayed) Labs Reviewed  COMPREHENSIVE METABOLIC PANEL - Abnormal; Notable for the following components:      Result Value   Glucose, Bld 142 (*)    Total Bilirubin 1.3 (*)    All other components within normal limits  URINALYSIS, ROUTINE W REFLEX MICROSCOPIC - Abnormal; Notable for the following components:   Color, Urine STRAW (*)    Specific Gravity, Urine 1.004 (*)    All other components within normal limits  CBC WITH DIFFERENTIAL/PLATELET - Abnormal; Notable for the following components:   WBC 13.5 (*)    Neutro Abs 10.7 (*)    All other components within normal limits  I-STAT CHEM 8, ED - Abnormal; Notable for the following components:   Glucose, Bld 132 (*)    Calcium, Ion 1.14 (*)    Hemoglobin 11.2 (*)    HCT 33.0 (*)    All other components within normal limits  LIPASE, BLOOD  TROPONIN I (HIGH SENSITIVITY)  TROPONIN I (HIGH SENSITIVITY)    EKG EKG Interpretation  Date/Time:  Thursday January 16 2023 10:47:38 EDT Ventricular Rate:  88 PR Interval:  145 QRS Duration: 87 QT Interval:  346 QTC Calculation: 419 R Axis:   8 Text Interpretation: Sinus rhythm Nonspecific T abnormalities, anterior leads Confirmed by Elayne Snare ([id number]) on 01/16/2023 11:03:37 AM  Radiology CT ABDOMEN PELVIS W CONTRAST  Result Date: 01/16/2023 CLINICAL DATA:  Left lower quadrant abdominal pain. History of diverticulitis. EXAM: CT ABDOMEN AND PELVIS WITH CONTRAST TECHNIQUE: Multidetector CT imaging of the abdomen and pelvis was performed using  the standard protocol following bolus administration of intravenous contrast. RADIATION DOSE REDUCTION: This exam was performed according to the departmental dose-optimization program which includes automated exposure control, adjustment of the mA and/or kV according to patient size and/or use of iterative reconstruction technique. CONTRAST:  75mL OMNIPAQUE IOHEXOL 350 MG/ML SOLN COMPARISON:  03/28/2022 FINDINGS: Lower chest: The lung bases are clear of acute process. No pleural effusion or pulmonary lesions. The heart is normal in size. No pericardial effusion. The distal esophagus and aorta are unremarkable. Hepatobiliary: No hepatic lesions are identified. Slightly progressive intrahepatic and extrahepatic biliary dilatation likely related to prior cholecystectomy. Pancreas: Normal Spleen: Normal Adrenals/Urinary Tract: No significant  findings. Stomach/Bowel: The stomach, duodenum, small, terminal ileum and appendix are normal. Severe acute diverticulitis at the descending colon sigmoid colon junction region with marked wall thickening and pericolonic inflammatory changes. No evidence of perforation or abscess. Severe sigmoid colon diverticulosis. Vascular/Lymphatic: The aorta is normal in caliber. No dissection. The branch vessels are patent. The major venous structures are patent. No mesenteric or retroperitoneal mass or adenopathy. Small scattered lymph nodes are noted. Reproductive: No significant findings. Other: No free air or free fluid. Musculoskeletal: No significant bony findings. IMPRESSION: 1. Severe acute diverticulitis at the descending colon sigmoid colon junction region. No evidence of perforation or abscess. 2. Slightly progressive intrahepatic and extrahepatic biliary dilatation likely related to prior cholecystectomy. Electronically Signed   By: Rudie Meyer M.D.   On: 01/16/2023 11:53   DG Chest 2 View  Result Date: 01/16/2023 CLINICAL DATA:  Provided history: Epigastric/lower chest pain.  Nausea. EXAM: CHEST - 2 VIEW COMPARISON:  Prior chest radiographs 01/29/2022 and earlier. FINDINGS: Heart size within normal limits. No appreciable airspace consolidation. No evidence of pleural effusion or pneumothorax. Widened left acromioclavicular joint. Soft tissue anchors about the right humeral head. IMPRESSION: 1. No evidence of an acute cardiopulmonary abnormality. 2. Widened left acromioclavicular joint suggesting prior AC joint injury. Electronically Signed   By: Jackey Loge D.O.   On: 01/16/2023 11:52    Procedures Procedures    Medications Ordered in ED Medications  Ampicillin-Sulbactam (UNASYN) 3 g in sodium chloride 0.9 % 100 mL IVPB (has no administration in time range)  morphine (PF) 4 MG/ML injection 4 mg (4 mg Intravenous Given 01/16/23 0939)  ondansetron (ZOFRAN) injection 4 mg (4 mg Intravenous Given 01/16/23 0938)  morphine (PF) 4 MG/ML injection 4 mg (4 mg Intravenous Given 01/16/23 1042)  iohexol (OMNIPAQUE) 350 MG/ML injection 75 mL (75 mLs Intravenous Contrast Given 01/16/23 1126)    ED Course/ Medical Decision Making/ A&P Clinical Course as of 01/16/23 1334  Thu Jan 16, 2023  1102 Patient called out as she was having worsening epigastric pain with nausea and felt diaphoretic.  He states that this pain lasted several minutes before starting to ease off.  She states that she is developing left lower quadrant pain at this time.  The patient's workup does show leukocytosis but normal LFTs and lipase.  The patient will be EKG and chest x-ray performed.  She was given additional morphine for pain. [VK]  1310 Repeat troponin is negative. CTAP with severe diverticulitis but no evidence of perforation. She will be started on antibiotics. Patient will be admitted for pain control as she is requiring multiple doses of IV narcotics. [VK]    Clinical Course User Index [VK] Rexford Maus, DO                             Medical Decision Making This patient presents to the  ED with chief complaint(s) of abdominal pain with pertinent past medical history of recurrent diverticulitis which further complicates the presenting complaint. The complaint involves an extensive differential diagnosis and also carries with it a high risk of complications and morbidity.    The differential diagnosis includes diverticulitis, other intra-abdominal infection, UTI, dehydration, electrolyte abnormality, nephrolithiasis  Additional history obtained: Additional history obtained from N/A Records reviewed previous admission documents  ED Course and Reassessment: On patient's arrival she is hemodynamically stable in no acute distress.  She is point tender in the left lower quadrant and low-grade temperature.  Concern is for possible diverticulitis and with her recent episode concern for complications such as perforation or abscess.  The patient will have labs and CT abdomen and pelvis performed.  She was given morphine and Zofran for symptomatic management and will be closely reassessed.  Independent labs interpretation:  The following labs were independently interpreted: leukocytosis, otherwise within normal range  Independent visualization of imaging: - I independently visualized the following imaging with scope of interpretation limited to determining acute life threatening conditions related to emergency care: CTAP, CXR, which revealed severe diverticulitis without complication  Consultation: - Consulted or discussed management/test interpretation w/ external professional: hospitalist  Consideration for admission or further workup: patient requires admission for IV antibiotics and pain control Social Determinants of health: N/A    Amount and/or Complexity of Data Reviewed Labs: ordered. Radiology: ordered. ECG/medicine tests: ordered.  Risk Prescription drug management. Decision regarding hospitalization.          Final Clinical Impression(s) / ED  Diagnoses Final diagnoses:  Diverticulitis    Rx / DC Orders ED Discharge Orders     None         Rexford Maus, DO 01/16/23 1334

## 2023-01-16 NOTE — ED Triage Notes (Addendum)
Here by POV from home for recurrent LLQ pain, h/o diverticulitis, "feels the same", recent monotherapy ABT x2, finished augmentin, then 2 weeks ago finished doxycycline. C/o LLQ pain, chills, low grade fever "100.4", nausea, and bladder spasms and pressure. Sx onset last night. Describes as frequent/ recurrent. Denies bleeding, diarrhea, syncope, sob. See allergies. No meds taken in last 8 hrs.

## 2023-01-16 NOTE — H&P (Signed)
History and Physical   Carol Hamilton ZOX:[id number] DOB: [birth year] DOA: 01/16/2023  PCP: Joycelyn Rua, MD   Patient coming from: Home  Chief Complaint: Abdominal pain  HPI: Carol Hamilton is a 57 y.o. female with medical history significant of hypertension, ovarian endometriosis status post surgical resection, diverticulosis, GERD, migraine, RLS presenting with worsening left lower quadrant pain.  Patient was treated for episode of diverticulitis about a month ago.  She has had some intermittent abdominal pressure since that time but pain had largely resolved.  She reports that last night she had increasing pain and a low-grade fever overnight.  She is to continue to have significant pain with some waves of more intense pain that radiates up her abdomen.  Some nausea this morning. She denies fevers, chills, chest pain, shortness of breath, constipation, diarrhea, vomiting.  ED Course: Vital signs in the ED notable for blood pressure in the 120s to 150s systolic.  Lab workup included CMP with glucose 142, T. bili 1.3.  CBC showed leukocytosis to 13.5.  Troponin negative x 2.  Lipase normal.  Urinalysis without acute abnormality.  Chest x-ray showed no acute normality but did showed evidence of prior AC joint injury.  CT abdomen pelvis showed severe acute diverticulitis.  Also noted was biliary dilation in the setting of cholecystectomy.  Patient received morphine, Zofran in the ED.  Due to patient requiring multiple doses of IV pain medication to control her pain and recent outpatient treatment of diverticulitis without complete resolution inpatient treatment requested.  Review of Systems: As per HPI otherwise all other systems reviewed and are negative.  Past Medical History:  Diagnosis Date   Adhesive capsulitis of left shoulder    Diverticulitis    GERD (gastroesophageal reflux disease)    Headache(784.0)    Migraines   History of endometriosis    left ovary--  s/p  lso  2012    Hypertension    Labral tear of shoulder    LEFT   Migraines    OA (osteoarthritis) of knee    RIGHT   PVC's (premature ventricular contractions)    RLS (restless legs syndrome)    Wears glasses     Past Surgical History:  Procedure Laterality Date   FOOT SURGERY  child   FBO-removed needle left foot   KNEE ARTHROSCOPY WITH ANTERIOR CRUCIATE LIGAMENT (ACL) REPAIR Right 2013   KNEE ARTHROSCOPY WITH MEDIAL MENISECTOMY Right 12/16/2013   Procedure: I & D Mass with excisional biopsy right knee;  Surgeon: Eugenia Mcalpine, MD;  Location: [facility];  Service: Orthopedics;  Laterality: Right;   LAPAROSCOPIC CHOLECYSTECTOMY  2008    [facility]   ROBOT ASSISTED LEFT SALPINGOOPHORECTOMY /  RIGHT SALPINGECTOMY  06-11-2011   SHOULDER ARTHROSCOPY W/ LABRAL REPAIR Right 2004   [name]   SHOULDER ARTHROSCOPY WITH SUBACROMIAL DECOMPRESSION, ROTATOR CUFF REPAIR AND BICEP TENDON REPAIR Left 02/03/2014   Procedure: LEFT SHOULDER ARTHROSCOPY WITH MANIPULATION UNDER ANESTHESIA/RELEASES SUBACROMIAL DECOMPRESSION/DISTAL CLAVICLE RESECTION/LABRAL DEBRIDEMENT.;  Surgeon: Eugenia Mcalpine, MD;  Location: [facility];  Service: Orthopedics;  Laterality: Left;  ANESTHESIA:  GENERAL/INTERSCALENE BLOCK    Social History  reports that she has never smoked. She has never used smokeless tobacco. She reports that she does not currently use alcohol after a past usage of about 1.0 standard drink of alcohol per week. She reports that she does not use drugs.  Allergies  Allergen Reactions   Amoxil [Amoxicillin] Nausea And Vomiting   Augmentin [Amoxicillin-Pot Clavulanate] Nausea And Vomiting  Azithromycin Swelling   Erythromycin Swelling   Flagyl [Metronidazole] Nausea And Vomiting   Lactaid [Tilactase] Diarrhea   Lactose Diarrhea, Itching and Nausea And Vomiting   Adhesive [Tape] Rash   Latex Rash   Sulfa Antibiotics Rash    Family History  Problem Relation Age of  Onset   Anesthesia problems Sister    Diverticulosis Neg Hx   Reviewed on admission  Prior to Admission medications   Medication Sig Start Date End Date Taking? Authorizing Provider  acebutolol (SECTRAL) 200 MG capsule Take 200 mg by mouth every morning.     [provider]  acetaminophen (TYLENOL) 325 MG tablet Take 650 mg by mouth daily as needed for mild pain.    [provider]  albuterol (VENTOLIN HFA) 108 (90 Base) MCG/ACT inhaler Inhale 2 puffs into the lungs every 6 (six) hours as needed for wheezing or shortness of breath. 02/07/22   [provider]  EPINEPHrine 0.3 mg/0.3 mL IJ SOAJ injection Inject 0.3 mg into the muscle as needed for anaphylaxis. 02/07/22   [provider]  fluticasone (FLONASE) 50 MCG/ACT nasal spray Place 1 spray into both nostrils daily.    [provider]  ipratropium (ATROVENT) 0.06 % nasal spray Place 1 spray into both nostrils daily. 01/29/22   [provider]  lisinopril (ZESTRIL) 10 MG tablet Take 10 mg by mouth daily. 01/14/22   [provider]  Multiple Vitamins-Minerals (CENTRUM SILVER 50+WOMEN PO) Take 1 tablet by mouth daily.    [provider]  omeprazole (PRILOSEC) 40 MG capsule Take 40 mg by mouth daily.    [provider]  ondansetron (ZOFRAN) 4 MG tablet Take 1 tablet (4 mg total) by mouth daily as needed for nausea or vomiting. 02/27/22 02/27/23  Burnadette Pop, MD  rizatriptan (MAXALT-MLT) 10 MG disintegrating tablet Take 10 mg by mouth See admin instructions. 10 mg PRN migraine, may repeat 10 mg in 2 hours if needed    [provider]    Physical Exam: Vitals:   01/16/23 0945 01/16/23 1030 01/16/23 1100 01/16/23 1315  BP: 133/79 128/77 135/67   Pulse: 96 87 91   Resp: 19 (!) 28 19   Temp:    99.4 F (37.4 C)  TempSrc:    Oral  SpO2: 100% 100% 96%   Weight:      Height:        Physical Exam Constitutional:      General: She is not in acute distress.     Appearance: Normal appearance.  HENT:     Head: Normocephalic and atraumatic.     Mouth/Throat:     Mouth: Mucous membranes are moist.     Pharynx: Oropharynx is clear.  Eyes:     Extraocular Movements: Extraocular movements intact.     Pupils: Pupils are equal, round, and reactive to light.  Cardiovascular:     Rate and Rhythm: Normal rate and regular rhythm.     Pulses: Normal pulses.     Heart sounds: Normal heart sounds.  Pulmonary:     Effort: Pulmonary effort is normal. No respiratory distress.     Breath sounds: Normal breath sounds.  Abdominal:     General: Bowel sounds are normal. There is no distension.     Palpations: Abdomen is soft.     Tenderness: There is abdominal tenderness.  Musculoskeletal:        General: No swelling or deformity.  Skin:    General: Skin is warm and dry.  Neurological:     General: No focal deficit present.     Mental Status: Mental status is at baseline.    Labs on Admission: I have personally reviewed following labs and imaging studies  CBC: Recent Labs  Lab 01/16/23 0921 01/16/23 0948  WBC 13.5*  --   NEUTROABS 10.7*  --   HGB 13.2 11.2*  HCT 39.1 33.0*  MCV 91.4  --   PLT 277  --     Basic Metabolic Panel: Recent Labs  Lab 01/16/23 0921 01/16/23 0948  NA 138 138  K 3.6 3.7  CL 102 105  CO2 22  --   GLUCOSE 142* 132*  BUN 8 8  CREATININE 0.82 0.70  CALCIUM 9.6  --     GFR: Estimated Creatinine Clearance: 91.4 mL/min (by C-G formula based on SCr of 0.7 mg/dL).  Liver Function Tests: Recent Labs  Lab 01/16/23 0921  AST 20  ALT 21  ALKPHOS 78  BILITOT 1.3*  PROT 7.3  ALBUMIN 4.2    Urine analysis:    Component Value Date/Time   COLORURINE STRAW (A) 01/16/2023 0912   APPEARANCEUR CLEAR 01/16/2023 0912   LABSPEC 1.004 (L) 01/16/2023 0912   PHURINE 8.0 01/16/2023 0912   GLUCOSEU NEGATIVE 01/16/2023 0912   HGBUR NEGATIVE 01/16/2023 0912   BILIRUBINUR NEGATIVE 01/16/2023 0912   KETONESUR NEGATIVE  01/16/2023 0912   PROTEINUR NEGATIVE 01/16/2023 0912   UROBILINOGEN 0.2 03/18/2014 0843   NITRITE NEGATIVE 01/16/2023 0912   LEUKOCYTESUR NEGATIVE 01/16/2023 0912    Radiological Exams on Admission: CT ABDOMEN PELVIS W CONTRAST  Result Date: 01/16/2023 CLINICAL DATA:  Left lower quadrant abdominal pain. History of diverticulitis. EXAM: CT ABDOMEN AND PELVIS WITH CONTRAST TECHNIQUE: Multidetector CT imaging of the abdomen and pelvis was performed using the standard protocol following bolus administration of intravenous contrast. RADIATION DOSE REDUCTION: This exam was performed according to the departmental dose-optimization program which includes automated exposure control, adjustment of the mA and/or kV according to patient size and/or use of iterative reconstruction technique. CONTRAST:  75mL OMNIPAQUE IOHEXOL 350 MG/ML SOLN COMPARISON:  03/28/2022 FINDINGS: Lower chest: The lung bases are clear of acute process. No pleural effusion or pulmonary lesions. The heart is normal in size. No pericardial effusion. The distal esophagus and aorta are unremarkable. Hepatobiliary: No hepatic lesions are identified. Slightly progressive intrahepatic and extrahepatic biliary dilatation likely related to prior cholecystectomy. Pancreas: Normal Spleen: Normal Adrenals/Urinary Tract: No significant findings. Stomach/Bowel: The stomach, duodenum, small, terminal ileum and appendix are normal. Severe acute diverticulitis at the descending colon sigmoid colon junction region with marked wall thickening and pericolonic inflammatory changes. No evidence of perforation or abscess. Severe sigmoid colon diverticulosis. Vascular/Lymphatic: The aorta is normal in caliber. No dissection. The branch vessels are patent. The major venous structures are patent. No mesenteric or retroperitoneal mass or adenopathy. Small scattered lymph nodes are noted. Reproductive: No significant findings. Other: No free air or free fluid.  Musculoskeletal: No significant bony findings. IMPRESSION: 1. Severe acute diverticulitis at the descending colon sigmoid colon junction region. No evidence of perforation or abscess. 2. Slightly progressive intrahepatic and extrahepatic biliary dilatation likely related to prior cholecystectomy. Electronically Signed   By: Rudie Meyer M.D.   On: 01/16/2023 11:53   DG Chest 2 View  Result Date: 01/16/2023 CLINICAL DATA:  Provided history: Epigastric/lower chest pain. Nausea. EXAM: CHEST - 2 VIEW COMPARISON:  Prior chest radiographs 01/29/2022 and earlier. FINDINGS: Heart size within normal limits. No appreciable airspace consolidation. No evidence  of pleural effusion or pneumothorax. Widened left acromioclavicular joint. Soft tissue anchors about the right humeral head. IMPRESSION: 1. No evidence of an acute cardiopulmonary abnormality. 2. Widened left acromioclavicular joint suggesting prior AC joint injury. Electronically Signed   By: Jackey Loge D.O.   On: 01/16/2023 11:52    EKG: Independently reviewed.  Sinus rhythm at 80 bpm.  Nonspecific T wave changes.  Assessment/Plan Principal Problem:   Acute diverticulitis Active Problems:   Hypertension   GERD (gastroesophageal reflux disease)   Migraines   Acute diverticulitis > Known history of diverticular disease treated outpatient for diverticulitis a month ago with some intermittent pressure since and now severe pain starting last night.  Pain is requiring IV opioids in the ED to control and is noted to have leukocytosis 13.5 with severe acute diverticulitis on CT. - Monitor on MedSurg with continuous pulse ox - Zosyn IV (Hx of severe N/V with Flagyl) - N.p.o. except sips with meds and ice chips for now, will advance as tolerated - As needed Dilaudid for moderate to severe pain - IV fluids - Supportive care  Hypertension - Continue home lisinopril - Continue home acetubolol   GERD - Continue PPI  Migraines - Continue as needed  rizatriptan  DVT prophylaxis: Lovenox Code Status:   Full Family Communication:  Updated at bedside  Disposition Plan:   Patient is from:  Home  Anticipated DC to:  Home  Anticipated DC date:  1 to 2 days  Anticipated DC barriers: None  Consults called:  None Admission status:  Observation, MedSurg with continuous pulse ox  Severity of Illness: The appropriate patient status for this patient is OBSERVATION. Observation status is judged to be reasonable and necessary in order to provide the required intensity of service to ensure the patient's safety. The patient's presenting symptoms, physical exam findings, and initial radiographic and laboratory data in the context of their medical condition is felt to place them at decreased risk for further clinical deterioration. Furthermore, it is anticipated that the patient will be medically stable for discharge from the hospital within 2 midnights of admission.    Synetta Fail MD [facility]  How to contact the [facility] Attending or Consulting provider 7A - 7P or covering provider during after hours 7P -7A, for this patient?   Check the care team in [facility] and look for a) attending/consulting [facility] provider listed and b) the [facility] team listed Log into [email or web address] and use [facility] universal password to access. If you do not have the password, please contact the hospital operator. Locate the [facility] provider you are looking for under [facility] and page to a number that you can be directly reached. If you still have difficulty reaching the provider, please page the [facility] (Director on Call) for the Hospitalists listed on amion for assistance.  01/16/2023, 2:03 PM

## 2023-01-17 ENCOUNTER — Inpatient Hospital Stay (HOSPITAL_COMMUNITY): Payer: BC Managed Care – PPO

## 2023-01-17 ENCOUNTER — Observation Stay (HOSPITAL_COMMUNITY): Payer: BC Managed Care – PPO

## 2023-01-17 DIAGNOSIS — Z883 Allergy status to other anti-infective agents status: Secondary | ICD-10-CM | POA: Diagnosis not present

## 2023-01-17 DIAGNOSIS — Z91040 Latex allergy status: Secondary | ICD-10-CM | POA: Diagnosis not present

## 2023-01-17 DIAGNOSIS — Z881 Allergy status to other antibiotic agents status: Secondary | ICD-10-CM | POA: Diagnosis not present

## 2023-01-17 DIAGNOSIS — Z880 Allergy status to penicillin: Secondary | ICD-10-CM | POA: Diagnosis not present

## 2023-01-17 DIAGNOSIS — Z91011 Allergy to milk products: Secondary | ICD-10-CM | POA: Diagnosis not present

## 2023-01-17 DIAGNOSIS — R7401 Elevation of levels of liver transaminase levels: Secondary | ICD-10-CM | POA: Diagnosis present

## 2023-01-17 DIAGNOSIS — G2581 Restless legs syndrome: Secondary | ICD-10-CM | POA: Diagnosis present

## 2023-01-17 DIAGNOSIS — K838 Other specified diseases of biliary tract: Secondary | ICD-10-CM | POA: Diagnosis present

## 2023-01-17 DIAGNOSIS — I10 Essential (primary) hypertension: Secondary | ICD-10-CM | POA: Diagnosis present

## 2023-01-17 DIAGNOSIS — K2100 Gastro-esophageal reflux disease with esophagitis, without bleeding: Secondary | ICD-10-CM

## 2023-01-17 DIAGNOSIS — Z79899 Other long term (current) drug therapy: Secondary | ICD-10-CM | POA: Diagnosis not present

## 2023-01-17 DIAGNOSIS — Z9049 Acquired absence of other specified parts of digestive tract: Secondary | ICD-10-CM | POA: Diagnosis not present

## 2023-01-17 DIAGNOSIS — K5792 Diverticulitis of intestine, part unspecified, without perforation or abscess without bleeding: Secondary | ICD-10-CM | POA: Diagnosis not present

## 2023-01-17 DIAGNOSIS — K219 Gastro-esophageal reflux disease without esophagitis: Secondary | ICD-10-CM | POA: Diagnosis present

## 2023-01-17 DIAGNOSIS — Z882 Allergy status to sulfonamides status: Secondary | ICD-10-CM | POA: Diagnosis not present

## 2023-01-17 DIAGNOSIS — K760 Fatty (change of) liver, not elsewhere classified: Secondary | ICD-10-CM | POA: Diagnosis present

## 2023-01-17 DIAGNOSIS — K5732 Diverticulitis of large intestine without perforation or abscess without bleeding: Secondary | ICD-10-CM | POA: Diagnosis present

## 2023-01-17 LAB — CBC
HCT: 33.3 % — ABNORMAL LOW (ref 36.0–46.0)
Hemoglobin: 11.3 g/dL — ABNORMAL LOW (ref 12.0–15.0)
MCH: 31.7 pg (ref 26.0–34.0)
MCHC: 33.9 g/dL (ref 30.0–36.0)
MCV: 93.3 fL (ref 80.0–100.0)
Platelets: 215 10*3/uL (ref 150–400)
RBC: 3.57 MIL/uL — ABNORMAL LOW (ref 3.87–5.11)
RDW: 12.4 % (ref 11.5–15.5)
WBC: 10 10*3/uL (ref 4.0–10.5)
nRBC: 0 % (ref 0.0–0.2)

## 2023-01-17 LAB — COMPREHENSIVE METABOLIC PANEL
ALT: 77 U/L — ABNORMAL HIGH (ref 0–44)
AST: 74 U/L — ABNORMAL HIGH (ref 15–41)
Albumin: 3.4 g/dL — ABNORMAL LOW (ref 3.5–5.0)
Alkaline Phosphatase: 81 U/L (ref 38–126)
Anion gap: 10 (ref 5–15)
BUN: 8 mg/dL (ref 6–20)
CO2: 24 mmol/L (ref 22–32)
Calcium: 8.9 mg/dL (ref 8.9–10.3)
Chloride: 104 mmol/L (ref 98–111)
Creatinine, Ser: 0.83 mg/dL (ref 0.44–1.00)
GFR, Estimated: >60 mL/min (ref >60)
Glucose, Bld: 126 mg/dL — ABNORMAL HIGH (ref 70–99)
Potassium: 3.5 mmol/L (ref 3.5–5.1)
Sodium: 138 mmol/L (ref 135–145)
Total Bilirubin: 1.4 mg/dL — ABNORMAL HIGH (ref 0.3–1.2)
Total Protein: 6.4 g/dL — ABNORMAL LOW (ref 6.5–8.1)

## 2023-01-17 LAB — HEMOGLOBIN A1C
Hgb A1c MFr Bld: 5.5 % (ref 4.8–5.6)
Mean Plasma Glucose: 111.15 mg/dL

## 2023-01-17 MED ORDER — GADOBUTROL 1 MMOL/ML IV SOLN
9.0000 mL | Freq: Once | INTRAVENOUS | Status: AC | PRN
  Administered 2023-01-17: 9 mL via INTRAVENOUS

## 2023-01-17 NOTE — Progress Notes (Signed)
[facility]                                                                              [place], is a 57 y.o. female, DOB - 09-Sep-1965, ZOX:[id number] Admit date - 01/16/2023    Outpatient Primary MD for the patient is Joycelyn Rua, MD  LOS - 0  days  Chief Complaint  Patient presents with   Abdominal Pain       Brief summary   Patient is a 57 year old female with HTN, endometriosis status post resection, diverticulosis, GERD, migraine, RLS presented with worsening left lower quadrant abdominal pain.  Patient was treated for episode of diverticulitis about a month ago with oral antibiotics.  Patient reported that at night before the admission she had increasing pain and low-grade fevers, continued to have significant pain with some waves of more intense pain radiating up to her abdomen. CBC showed leukocytosis to 13.5, lipase normal CT abdomen showed severe acute diverticulitis with biliary dilation in the setting of prior cholecystectomy. Patient was admitted for further workup   Assessment & Plan    Principal Problem:   Acute diverticulitis, recurrent -Known history of diverticular disease with recurrent diverticulitis.  Was treated with oral antibiotics for acute diverticulitis. -Continue IV Zosyn -Continue pain control, antiemetics, IVF -N.p.o. while awaiting GI evaluation  Active Problems: Acute transaminitis -Unclear etiology, total bili 1.4 today<-1.3 on admission, AST, ALT trending up -Prior history of cholecystectomy.  Right upper quadrant ultrasound showed dilated common bile duct measuring up to 15 mm, mild intrahepatic biliary duct dilation, may be secondary to cholecystectomy status.  Hepatic steatosis -Will discuss with GI    Hypertension -Stable, continue lisinopril     GERD (gastroesophageal reflux disease) -Continue PPI    Migraines -Continue Imitrex as needed    Estimated body mass index is 27.98 kg/m as calculated  from the following:   Height as of this encounter: 5\' 10"  (1.778 m).   Weight as of this encounter: 88.5 kg.  Code Status: Full code DVT Prophylaxis:  enoxaparin (LOVENOX) injection 40 mg Start: 01/16/23 1400   Level of Care: Level of care: Med-Surg Family Communication: Updated patient Disposition Plan:      Remains inpatient appropriate:      Procedures:    Consultants:   Gastroenterology   Antimicrobials:   Anti-infectives (From admission, onward)    Start     Dose/Rate Route Frequency Ordered Stop   01/16/23 1400  piperacillin-tazobactam (ZOSYN) IVPB 3.375 g        3.375 g 12.5 mL/hr over 240 Minutes Intravenous Every 8 hours 01/16/23 1354     01/16/23 1345  cefTRIAXone (ROCEPHIN) 2 g in sodium chloride 0.9 % 100 mL IVPB  Status:  Discontinued        2 g 200 mL/hr over 30 Minutes Intravenous Daily 01/16/23 1334 01/16/23 1350   01/16/23 1345  metroNIDAZOLE (FLAGYL) IVPB 500 mg  Status:  Discontinued        500 mg 100 mL/hr over 60 Minutes Intravenous 2 times daily 01/16/23 1334 01/16/23 1350   01/16/23 1315  Ampicillin-Sulbactam (UNASYN) 3 g in sodium chloride 0.9 % 100  mL IVPB  Status:  Discontinued        3 g 200 mL/hr over 30 Minutes Intravenous  Once 01/16/23 1302 01/16/23 1334          Medications  acebutolol  200 mg Oral q morning   enoxaparin (LOVENOX) injection  40 mg Subcutaneous Q24H   lisinopril  10 mg Oral Daily   pantoprazole  40 mg Oral Daily   sodium chloride flush  3 mL Intravenous Q12H      Subjective:   Perlie Gold was seen and examined today.  States had diffuse abdominal pain yesterday with right-sided pain and left lower quadrant pain, radiating upwards.  Pain is now improving and more localized to left lower quadrant.  No nausea vomiting or fevers.  Feeling somewhat better today.  Objective:   Vitals:   01/16/23 1952 01/17/23 0455 01/17/23 0746 01/17/23 1133  BP: 116/73 109/66 115/76 127/72  Pulse: 84 74 78 75  Resp: 16 16  18 18   Temp: 98.2 F (36.8 C) 98.1 F (36.7 C) 98 F (36.7 C) 98.1 F (36.7 C)  TempSrc: Oral Oral Oral Oral  SpO2: 98% 99% 100% 98%  Weight:      Height:       No intake or output data in the 24 hours ending 01/17/23 1200   Wt Readings from Last 3 Encounters:  01/16/23 88.5 kg  04/02/22 87.1 kg  02/23/22 90.3 kg     Exam General: Alert and oriented x 3, NAD Cardiovascular: S1 S2 auscultated,  RRR Respiratory: Clear to auscultation bilaterally, no wheezing Gastrointestinal: Soft, LLQ TTP, nondistended, + bowel sounds Ext: no pedal edema bilaterally Neuro: no new deficits psych: Normal affect     Data Reviewed:  I have personally reviewed following labs    CBC Lab Results  Component Value Date   WBC 10.0 01/17/2023   RBC 3.57 (L) 01/17/2023   HGB 11.3 (L) 01/17/2023   HCT 33.3 (L) 01/17/2023   MCV 93.3 01/17/2023   MCH 31.7 01/17/2023   PLT 215 01/17/2023   MCHC 33.9 01/17/2023   RDW 12.4 01/17/2023   LYMPHSABS 1.8 01/16/2023   MONOABS 0.9 01/16/2023   EOSABS 0.1 01/16/2023   BASOSABS 0.0 01/16/2023     Last metabolic panel Lab Results  Component Value Date   NA 138 01/17/2023   K 3.5 01/17/2023   CL 104 01/17/2023   CO2 24 01/17/2023   BUN 8 01/17/2023   CREATININE 0.83 01/17/2023   GLUCOSE 126 (H) 01/17/2023   GFRNONAA >60 01/17/2023   GFRAA >90 03/18/2014   CALCIUM 8.9 01/17/2023   PROT 6.4 (L) 01/17/2023   ALBUMIN 3.4 (L) 01/17/2023   BILITOT 1.4 (H) 01/17/2023   ALKPHOS 81 01/17/2023   AST 74 (H) 01/17/2023   ALT 77 (H) 01/17/2023   ANIONGAP 10 01/17/2023    CBG (last 3)  No results for input(s): "GLUCAP" in the last 72 hours.    Coagulation Profile: No results for input(s): "INR", "PROTIME" in the last 168 hours.   Radiology Studies: I have personally reviewed the imaging studies  US Abdomen Limited RUQ (LIVER/GB)  Result Date: 01/17/2023 CLINICAL DATA:  Abdominal pain. EXAM: ULTRASOUND ABDOMEN LIMITED RIGHT UPPER QUADRANT  COMPARISON:  CT examination performed earlier on the same date. FINDINGS: Gallbladder: Surgically absent. Common bile duct: Diameter: 15 mm Liver: No focal lesion identified. Echogenic hepatic parenchyma concerning for hepatic steatosis. Portal vein is patent on color Doppler imaging with normal direction of blood flow towards  the liver. Mild intrahepatic biliary ductal dilatation. Other: None. IMPRESSION: 1. Dilated common bile duct measuring up to 15 mm. Mild intrahepatic biliary ductal dilatation. It may be secondary to cholecystectomy status. Correlation with liver function tests is suggested. 2. Echogenic hepatic parenchyma concerning for hepatic steatosis. Electronically Signed   By: Larose Hires D.O.   On: 01/17/2023 11:34   CT ABDOMEN PELVIS W CONTRAST  Result Date: 01/16/2023 CLINICAL DATA:  Left lower quadrant abdominal pain. History of diverticulitis. EXAM: CT ABDOMEN AND PELVIS WITH CONTRAST TECHNIQUE: Multidetector CT imaging of the abdomen and pelvis was performed using the standard protocol following bolus administration of intravenous contrast. RADIATION DOSE REDUCTION: This exam was performed according to the departmental dose-optimization program which includes automated exposure control, adjustment of the mA and/or kV according to patient size and/or use of iterative reconstruction technique. CONTRAST:  75mL OMNIPAQUE IOHEXOL 350 MG/ML SOLN COMPARISON:  03/28/2022 FINDINGS: Lower chest: The lung bases are clear of acute process. No pleural effusion or pulmonary lesions. The heart is normal in size. No pericardial effusion. The distal esophagus and aorta are unremarkable. Hepatobiliary: No hepatic lesions are identified. Slightly progressive intrahepatic and extrahepatic biliary dilatation likely related to prior cholecystectomy. Pancreas: Normal Spleen: Normal Adrenals/Urinary Tract: No significant findings. Stomach/Bowel: The stomach, duodenum, small, terminal ileum and appendix are normal.  Severe acute diverticulitis at the descending colon sigmoid colon junction region with marked wall thickening and pericolonic inflammatory changes. No evidence of perforation or abscess. Severe sigmoid colon diverticulosis. Vascular/Lymphatic: The aorta is normal in caliber. No dissection. The branch vessels are patent. The major venous structures are patent. No mesenteric or retroperitoneal mass or adenopathy. Small scattered lymph nodes are noted. Reproductive: No significant findings. Other: No free air or free fluid. Musculoskeletal: No significant bony findings. IMPRESSION: 1. Severe acute diverticulitis at the descending colon sigmoid colon junction region. No evidence of perforation or abscess. 2. Slightly progressive intrahepatic and extrahepatic biliary dilatation likely related to prior cholecystectomy. Electronically Signed   By: Rudie Meyer M.D.   On: 01/16/2023 11:53   DG Chest 2 View  Result Date: 01/16/2023 CLINICAL DATA:  Provided history: Epigastric/lower chest pain. Nausea. EXAM: CHEST - 2 VIEW COMPARISON:  Prior chest radiographs 01/29/2022 and earlier. FINDINGS: Heart size within normal limits. No appreciable airspace consolidation. No evidence of pleural effusion or pneumothorax. Widened left acromioclavicular joint. Soft tissue anchors about the right humeral head. IMPRESSION: 1. No evidence of an acute cardiopulmonary abnormality. 2. Widened left acromioclavicular joint suggesting prior AC joint injury. Electronically Signed   By: Jackey Loge D.O.   On: 01/16/2023 11:52       Noemi Bellissimo M.D. [facility] 01/17/2023, 12:00 PM  Available via Epic secure chat 7am-7pm After 7 pm, please refer to night coverage provider listed on amion.

## 2023-01-17 NOTE — Consult Note (Signed)
[facility] Consultation Note  Referring Provider: [facility] Primary Care Physician:  Joycelyn Rua, MD Primary Gastroenterologist:  Dr. Ewing Schlein  Reason for Consultation:  abdominal pain  HPI: Carol Hamilton is a 57 y.o. female admitted for sigmoid colon diverticulitis.  Patient has had several attacks of diverticulitis over the past years.  While in hospital being treated this time for diverticulitis, she developed a different pain, in epigastrium and right upper quadrant, radiated to back, felt like prior gallbladder pain (post cholecystectomy).  No blood in stool or hematemesis.  Colonoscopy most recently November 2023 with Dr. Ewing Schlein showing universal diverticulosis and couple small cecal polyps (removed).  Her LLQ pain from diverticulitis is improving.  Her upper abdominal pain has currently stopped.   Past Medical History:  Diagnosis Date   Adhesive capsulitis of left shoulder    Diverticulitis    GERD (gastroesophageal reflux disease)    Headache(784.0)    Migraines   History of endometriosis    left ovary--  s/p  lso  2012   Hypertension    Labral tear of shoulder    LEFT   Migraines    OA (osteoarthritis) of knee    RIGHT   PVC's (premature ventricular contractions)    RLS (restless legs syndrome)    Wears glasses     Past Surgical History:  Procedure Laterality Date   FOOT SURGERY  child   FBO-removed needle left foot   KNEE ARTHROSCOPY WITH ANTERIOR CRUCIATE LIGAMENT (ACL) REPAIR Right 2013   KNEE ARTHROSCOPY WITH MEDIAL MENISECTOMY Right 12/16/2013   Procedure: I & D Mass with excisional biopsy right knee;  Surgeon: Eugenia Mcalpine, MD;  Location: [facility];  Service: Orthopedics;  Laterality: Right;   LAPAROSCOPIC CHOLECYSTECTOMY  2008    [facility]   ROBOT ASSISTED LEFT SALPINGOOPHORECTOMY /  RIGHT SALPINGECTOMY  06-11-2011   SHOULDER ARTHROSCOPY W/ LABRAL REPAIR Right 2004   [name]   SHOULDER ARTHROSCOPY  WITH SUBACROMIAL DECOMPRESSION, ROTATOR CUFF REPAIR AND BICEP TENDON REPAIR Left 02/03/2014   Procedure: LEFT SHOULDER ARTHROSCOPY WITH MANIPULATION UNDER ANESTHESIA/RELEASES SUBACROMIAL DECOMPRESSION/DISTAL CLAVICLE RESECTION/LABRAL DEBRIDEMENT.;  Surgeon: Eugenia Mcalpine, MD;  Location: [facility];  Service: Orthopedics;  Laterality: Left;  ANESTHESIA:  GENERAL/INTERSCALENE BLOCK    Prior to Admission medications   Medication Sig Start Date End Date Taking? Authorizing Provider  acebutolol (SECTRAL) 200 MG capsule Take 200 mg by mouth every morning.     [provider]  acetaminophen (TYLENOL) 325 MG tablet Take 650 mg by mouth daily as needed for mild pain.    [provider]  albuterol (VENTOLIN HFA) 108 (90 Base) MCG/ACT inhaler Inhale 2 puffs into the lungs every 6 (six) hours as needed for wheezing or shortness of breath. 02/07/22   [provider]  doxycycline (VIBRA-TABS) 100 MG tablet Take 100 mg by mouth daily. 12/25/22   [provider]  EPINEPHrine 0.3 mg/0.3 mL IJ SOAJ injection Inject 0.3 mg into the muscle as needed for anaphylaxis. 02/07/22   [provider]  fluticasone (FLONASE) 50 MCG/ACT nasal spray Place 1 spray into both nostrils daily.    [provider]  ipratropium (ATROVENT) 0.06 % nasal spray Place 1 spray into both nostrils daily. 01/29/22   [provider]  lisinopril (ZESTRIL) 10 MG tablet Take 10 mg by mouth daily. 01/14/22   [provider]  Multiple Vitamins-Minerals (CENTRUM SILVER 50+WOMEN PO) Take 1 tablet by mouth daily.    [provider]  omeprazole (PRILOSEC)  40 MG capsule Take 40 mg by mouth daily.    [provider]  ondansetron (ZOFRAN) 4 MG tablet Take 1 tablet (4 mg total) by mouth daily as needed for nausea or vomiting. 02/27/22 02/27/23  Burnadette Pop, MD  rizatriptan (MAXALT-MLT) 10 MG disintegrating tablet Take 10 mg by mouth See admin instructions. 10 mg  PRN migraine, may repeat 10 mg in 2 hours if needed    [provider]    Current Facility-Administered Medications  Medication Dose Route Frequency Provider Last Rate Last Admin   acebutolol (SECTRAL) capsule 200 mg  200 mg Oral q morning Synetta Fail, MD       acetaminophen (TYLENOL) tablet 650 mg  650 mg Oral Q6H PRN Synetta Fail, MD       Or   acetaminophen (TYLENOL) suppository 650 mg  650 mg Rectal Q6H PRN Synetta Fail, MD       albuterol (PROVENTIL) (2.5 MG/3ML) 0.083% nebulizer solution 2.5 mg  2.5 mg Inhalation Q6H PRN Synetta Fail, MD       enoxaparin (LOVENOX) injection 40 mg  40 mg Subcutaneous Q24H Synetta Fail, MD       HYDROmorphone (DILAUDID) injection 0.5-1 mg  0.5-1 mg Intravenous Q3H PRN Synetta Fail, MD   1 mg at 01/17/23 1504   lisinopril (ZESTRIL) tablet 10 mg  10 mg Oral Daily Synetta Fail, MD       ondansetron [facility]) injection 4 mg  4 mg Intravenous Q6H PRN Synetta Fail, MD   4 mg at 01/17/23 0556   pantoprazole (PROTONIX) EC tablet 40 mg  40 mg Oral Daily Synetta Fail, MD   40 mg at 01/17/23 0902   piperacillin-tazobactam (ZOSYN) IVPB 3.375 g  3.375 g Intravenous Q8H Francena Hanly, RPH 12.5 mL/hr at 01/17/23 1504 3.375 g at 01/17/23 1504   sodium chloride flush (NS) 0.9 % injection 3 mL  3 mL Intravenous Q12H Synetta Fail, MD   3 mL at 01/16/23 2113   SUMAtriptan (IMITREX) tablet 50 mg  50 mg Oral Q2H PRN Synetta Fail, MD        Allergies as of 01/16/2023 - Review Complete 01/16/2023  Allergen Reaction Noted   Amoxil [amoxicillin] Nausea And Vomiting 06/04/2011   Augmentin [amoxicillin-pot clavulanate] Nausea And Vomiting 09/18/2017   Azithromycin Swelling 06/04/2011   Erythromycin Swelling 12/14/2013   Flagyl [metronidazole] Nausea And Vomiting 09/18/2017   Lactaid [tilactase] Diarrhea 02/23/2022   Lactose Diarrhea, Itching, and Nausea And Vomiting 02/23/2022   Adhesive  [tape] Rash 12/14/2013   Latex Rash 02/23/2022   Sulfa antibiotics Rash 06/04/2011    Family History  Problem Relation Age of Onset   Anesthesia problems Sister    Diverticulosis Neg Hx     Social History   Socioeconomic History   Marital status: Married    Spouse name: Not on file   Number of children: Not on file   Years of education: Not on file   Highest education level: Not on file  Occupational History   Not on file  Tobacco Use   Smoking status: Never   Smokeless tobacco: Never  Substance and Sexual Activity   Alcohol use: Not Currently    Alcohol/week: 1.0 standard drink of alcohol    Types: 1 drink(s) per week    Comment: occasionally    Drug use: No   Sexual activity: Yes    Comment: Spouse with Vasectomy  Other Topics Concern  Not on file  Social History Narrative   Not on file   Social Determinants of Health   Financial Resource Strain: Not on file  Food Insecurity: Not on file  Transportation Needs: Not on file  Physical Activity: Not on file  Stress: Not on file  Social Connections: Not on file  Intimate Partner Violence: Not on file    Review of Systems: As per HPI, all others negative  Physical Exam: Vital signs in last 24 hours: Temp:  [98 F (36.7 C)-98.2 F (36.8 C)] 98.1 F (36.7 C) (06/21 1133) Pulse Rate:  [74-84] 75 (06/21 1133) Resp:  [16-18] 18 (06/21 1133) BP: (109-127)/(66-76) 127/72 (06/21 1133) SpO2:  [98 %-100 %] 98 % (06/21 1133) Last BM Date : 01/15/23 General:   Alert,  overweight, pleasant and cooperative in NAD Head:  Normocephalic and atraumatic. Eyes:  Sclera clear, no icterus.   Conjunctiva pink. Ears:  Normal auditory acuity. Nose:  No deformity, discharge,  or lesions. Mouth:  No deformity or lesions.  Oropharynx pink & moist. Neck:  Supple; no masses or thyromegaly. Lungs:  No respiratory distress Abdomen:  Soft, protuberant, mild LLQ tenderness without peritonitis, and nondistended. No masses,  hepatosplenomegaly or hernias noted. Without guarding, and without rebound.     Msk:  Symmetrical without gross deformities. Normal posture. Pulses:  Normal pulses noted. Extremities:  Without clubbing or edema. Neurologic:  Alert and  oriented x4;  grossly normal neurologically. Skin:  Intact without significant lesions or rashes. Psych:  Alert and cooperative. Normal mood and affect.   Lab Results: Recent Labs    01/16/23 0921 01/16/23 0948 01/17/23 0150  WBC 13.5*  --  10.0  HGB 13.2 11.2* 11.3*  HCT 39.1 33.0* 33.3*  PLT 277  --  215   BMET Recent Labs    01/16/23 0921 01/16/23 0948 01/17/23 0150  NA 138 138 138  K 3.6 3.7 3.5  CL 102 105 104  CO2 22  --  24  GLUCOSE 142* 132* 126*  BUN 8 8 8   CREATININE 0.82 0.70 0.83  CALCIUM 9.6  --  8.9   LFT Recent Labs    01/17/23 0150  PROT 6.4*  ALBUMIN 3.4*  AST 74*  ALT 77*  ALKPHOS 81  BILITOT 1.4*   PT/INR No results for input(s): "LABPROT", "INR" in the last 72 hours.  Studies/Results: US Abdomen Limited RUQ (LIVER/GB)  Result Date: 01/17/2023 CLINICAL DATA:  Abdominal pain. EXAM: ULTRASOUND ABDOMEN LIMITED RIGHT UPPER QUADRANT COMPARISON:  CT examination performed earlier on the same date. FINDINGS: Gallbladder: Surgically absent. Common bile duct: Diameter: 15 mm Liver: No focal lesion identified. Echogenic hepatic parenchyma concerning for hepatic steatosis. Portal vein is patent on color Doppler imaging with normal direction of blood flow towards the liver. Mild intrahepatic biliary ductal dilatation. Other: None. IMPRESSION: 1. Dilated common bile duct measuring up to 15 mm. Mild intrahepatic biliary ductal dilatation. It may be secondary to cholecystectomy status. Correlation with liver function tests is suggested. 2. Echogenic hepatic parenchyma concerning for hepatic steatosis. Electronically Signed   By: Larose Hires D.O.   On: 01/17/2023 11:34   CT ABDOMEN PELVIS W CONTRAST  Result Date:  01/16/2023 CLINICAL DATA:  Left lower quadrant abdominal pain. History of diverticulitis. EXAM: CT ABDOMEN AND PELVIS WITH CONTRAST TECHNIQUE: Multidetector CT imaging of the abdomen and pelvis was performed using the standard protocol following bolus administration of intravenous contrast. RADIATION DOSE REDUCTION: This exam was performed according to the departmental dose-optimization program  which includes automated exposure control, adjustment of the mA and/or kV according to patient size and/or use of iterative reconstruction technique. CONTRAST:  75mL OMNIPAQUE IOHEXOL 350 MG/ML SOLN COMPARISON:  03/28/2022 FINDINGS: Lower chest: The lung bases are clear of acute process. No pleural effusion or pulmonary lesions. The heart is normal in size. No pericardial effusion. The distal esophagus and aorta are unremarkable. Hepatobiliary: No hepatic lesions are identified. Slightly progressive intrahepatic and extrahepatic biliary dilatation likely related to prior cholecystectomy. Pancreas: Normal Spleen: Normal Adrenals/Urinary Tract: No significant findings. Stomach/Bowel: The stomach, duodenum, small, terminal ileum and appendix are normal. Severe acute diverticulitis at the descending colon sigmoid colon junction region with marked wall thickening and pericolonic inflammatory changes. No evidence of perforation or abscess. Severe sigmoid colon diverticulosis. Vascular/Lymphatic: The aorta is normal in caliber. No dissection. The branch vessels are patent. The major venous structures are patent. No mesenteric or retroperitoneal mass or adenopathy. Small scattered lymph nodes are noted. Reproductive: No significant findings. Other: No free air or free fluid. Musculoskeletal: No significant bony findings. IMPRESSION: 1. Severe acute diverticulitis at the descending colon sigmoid colon junction region. No evidence of perforation or abscess. 2. Slightly progressive intrahepatic and extrahepatic biliary dilatation likely  related to prior cholecystectomy. Electronically Signed   By: Rudie Meyer M.D.   On: 01/16/2023 11:53   DG Chest 2 View  Result Date: 01/16/2023 CLINICAL DATA:  Provided history: Epigastric/lower chest pain. Nausea. EXAM: CHEST - 2 VIEW COMPARISON:  Prior chest radiographs 01/29/2022 and earlier. FINDINGS: Heart size within normal limits. No appreciable airspace consolidation. No evidence of pleural effusion or pneumothorax. Widened left acromioclavicular joint. Soft tissue anchors about the right humeral head. IMPRESSION: 1. No evidence of an acute cardiopulmonary abnormality. 2. Widened left acromioclavicular joint suggesting prior AC joint injury. Electronically Signed   By: Jackey Loge D.O.   On: 01/16/2023 11:52    Impression:   Abdominal pain. Recurrent diverticulitis. Dilatation of bile duct with mild elevation LFTs; post cholecystectomy. Although counter to what we typically observe, I think patient has two different types of pain.  First, the lower abdominal pain that is likely diverticulitis-related.  Second, the upper abdominal pain is highly consistent with bile duct/stone process.  Suspect she recently either passed a bile duct stone, or the stone is now again free-floating in the bile duct.  Plan:  Treat diverticulitis; antibiotics, slowly advance diet, etc. MRI/MRCP. Diet as tolerated. [facility] will follow.   LOS: 0 days   Shoichi Mielke M  01/17/2023, 4:47 PM  Cell [phone] If no answer or after 5 PM call [phone]

## 2023-01-17 NOTE — Progress Notes (Signed)
Pt refusing Lovenox

## 2023-01-18 DIAGNOSIS — K5792 Diverticulitis of intestine, part unspecified, without perforation or abscess without bleeding: Secondary | ICD-10-CM | POA: Diagnosis not present

## 2023-01-18 DIAGNOSIS — K2100 Gastro-esophageal reflux disease with esophagitis, without bleeding: Secondary | ICD-10-CM | POA: Diagnosis not present

## 2023-01-18 DIAGNOSIS — R7401 Elevation of levels of liver transaminase levels: Secondary | ICD-10-CM | POA: Diagnosis not present

## 2023-01-18 DIAGNOSIS — I10 Essential (primary) hypertension: Secondary | ICD-10-CM | POA: Diagnosis not present

## 2023-01-18 LAB — CBC
HCT: 31.7 % — ABNORMAL LOW (ref 36.0–46.0)
Hemoglobin: 10.8 g/dL — ABNORMAL LOW (ref 12.0–15.0)
MCH: 31 pg (ref 26.0–34.0)
MCHC: 34.1 g/dL (ref 30.0–36.0)
MCV: 91.1 fL (ref 80.0–100.0)
Platelets: 214 10*3/uL (ref 150–400)
RBC: 3.48 MIL/uL — ABNORMAL LOW (ref 3.87–5.11)
RDW: 12.2 % (ref 11.5–15.5)
WBC: 7.6 10*3/uL (ref 4.0–10.5)
nRBC: 0 % (ref 0.0–0.2)

## 2023-01-18 LAB — COMPREHENSIVE METABOLIC PANEL
ALT: 54 U/L — ABNORMAL HIGH (ref 0–44)
AST: 37 U/L (ref 15–41)
Albumin: 3.3 g/dL — ABNORMAL LOW (ref 3.5–5.0)
Alkaline Phosphatase: 77 U/L (ref 38–126)
Anion gap: 9 (ref 5–15)
BUN: 11 mg/dL (ref 6–20)
CO2: 24 mmol/L (ref 22–32)
Calcium: 8.7 mg/dL — ABNORMAL LOW (ref 8.9–10.3)
Chloride: 105 mmol/L (ref 98–111)
Creatinine, Ser: 0.95 mg/dL (ref 0.44–1.00)
GFR, Estimated: >60 mL/min (ref >60)
Glucose, Bld: 99 mg/dL (ref 70–99)
Potassium: 3.5 mmol/L (ref 3.5–5.1)
Sodium: 138 mmol/L (ref 135–145)
Total Bilirubin: 1.3 mg/dL — ABNORMAL HIGH (ref 0.3–1.2)
Total Protein: 6.2 g/dL — ABNORMAL LOW (ref 6.5–8.1)

## 2023-01-18 NOTE — Progress Notes (Signed)
[facility] Progress Note  Carol Hamilton 57 y.o. [birth year]   Subjective: LLQ pain improving. Denies other abdominal pain. Husband in room.  Objective: Vital signs: Vitals:   01/18/23 0446 01/18/23 0743  BP: 133/76 127/67  Pulse: 75 76  Resp: 16   Temp: 98.2 F (36.8 C) 98.4 F (36.9 C)  SpO2: 100% 98%    Physical Exam: Gen: lethargic, well-nourished, no acute distress, pleasant HEENT: anicteric sclera CV: RRR Chest: CTA B Abd: LLQ tenderness with guarding, soft, nondistended, +BS Ext: no edema  Lab Results: Recent Labs    01/17/23 0150 01/18/23 0213  NA 138 138  K 3.5 3.5  CL 104 105  CO2 24 24  GLUCOSE 126* 99  BUN 8 11  CREATININE 0.83 0.95  CALCIUM 8.9 8.7*   Recent Labs    01/17/23 0150 01/18/23 0213  AST 74* 37  ALT 77* 54*  ALKPHOS 81 77  BILITOT 1.4* 1.3*  PROT 6.4* 6.2*  ALBUMIN 3.4* 3.3*   Recent Labs    01/16/23 0921 01/16/23 0948 01/17/23 0150 01/18/23 0213  WBC 13.5*  --  10.0 7.6  NEUTROABS 10.7*  --   --   --   HGB 13.2   < > 11.3* 10.8*  HCT 39.1   < > 33.3* 31.7*  MCV 91.4  --  93.3 91.1  PLT 277  --  215 214   < > = values in this interval not displayed.      Assessment/Plan: Transaminitis - MRCP showed intrahepatic and extrahepatic biliary dilation without any filling defects in the common bile duct. LFTs improving. May have passed a stone but no signs of biliary obstruction.  Diverticulitis - on IV Abx. Change to oral antibiotics tomorrow. Change to soft diet tomorrow if continues to tolerate full liquids today. Felt nauseous yesterday with clear liquids. Supportive care.   [name] 01/18/2023, 4:33 PM  Questions please call [phone] ID: Carol Hamilton, female   DOB: 05/28/1966, 57 y.o.   MRN: [id number]

## 2023-01-18 NOTE — Progress Notes (Signed)
[facility]                                                                              [place], is a 57 y.o. female, DOB - 1966/05/31, WNU:[id number] Admit date - 01/16/2023    Outpatient Primary MD for the patient is Joycelyn Rua, MD  LOS - 0  days  Chief Complaint  Patient presents with   Abdominal Pain       Brief summary   Patient is a 57 year old female with HTN, endometriosis status post resection, diverticulosis, GERD, migraine, RLS presented with worsening left lower quadrant abdominal pain.  Patient was treated for episode of diverticulitis about a month ago with oral antibiotics.  Patient reported that at night before the admission she had increasing pain and low-grade fevers, continued to have significant pain with some waves of more intense pain radiating up to her abdomen. CBC showed leukocytosis to 13.5, lipase normal CT abdomen showed severe acute diverticulitis with biliary dilation in the setting of prior cholecystectomy. Patient was admitted for further workup   Assessment & Plan    Principal Problem:   Acute diverticulitis, recurrent -Known history of diverticular disease with recurrent diverticulitis.  Was treated with oral antibiotics for acute diverticulitis. -Continue IV Zosyn -Continue pain control, antiemetics, IVF -Feeling somewhat better today, placed on full liquid diet -GI following  Active Problems: Acute transaminitis -LFTs improving. -Prior history of cholecystectomy.  Right upper quadrant ultrasound showed dilated common bile duct measuring up to 15 mm, mild intrahepatic biliary duct dilation, may be secondary to cholecystectomy status.  Hepatic steatosis -MRCP showed intra and extrahepatic bile duct dilation with CBD 15 mm with tapering to the level of ampulla, no focal mass lesion or filling defects. -Currently no RUQ abdominal pain, patient feels he may have passed the stone    Hypertension -Stable, continue  lisinopril     GERD (gastroesophageal reflux disease) -Continue PPI    Migraines -Continue Imitrex as needed    Estimated body mass index is 27.98 kg/m as calculated from the following:   Height as of this encounter: 5\' 10"  (1.778 m).   Weight as of this encounter: 88.5 kg.  Code Status: Full code DVT Prophylaxis:  enoxaparin (LOVENOX) injection 40 mg Start: 01/16/23 1400   Level of Care: Level of care: Med-Surg Family Communication: Updated patient Disposition Plan:      Remains inpatient appropriate:      Procedures:  MRCP  Consultants:   Gastroenterology   Antimicrobials:   Anti-infectives (From admission, onward)    Start     Dose/Rate Route Frequency Ordered Stop   01/16/23 1400  piperacillin-tazobactam (ZOSYN) IVPB 3.375 g        3.375 g 12.5 mL/hr over 240 Minutes Intravenous Every 8 hours 01/16/23 1354     01/16/23 1345  cefTRIAXone (ROCEPHIN) 2 g in sodium chloride 0.9 % 100 mL IVPB  Status:  Discontinued        2 g 200 mL/hr over 30 Minutes Intravenous Daily 01/16/23 1334 01/16/23 1350   01/16/23 1345  metroNIDAZOLE (FLAGYL) IVPB 500 mg  Status:  Discontinued  500 mg 100 mL/hr over 60 Minutes Intravenous 2 times daily 01/16/23 1334 01/16/23 1350   01/16/23 1315  Ampicillin-Sulbactam (UNASYN) 3 g in sodium chloride 0.9 % 100 mL IVPB  Status:  Discontinued        3 g 200 mL/hr over 30 Minutes Intravenous  Once 01/16/23 1302 01/16/23 1334          Medications  acebutolol  200 mg Oral q morning   enoxaparin (LOVENOX) injection  40 mg Subcutaneous Q24H   lisinopril  10 mg Oral Daily   pantoprazole  40 mg Oral Daily   sodium chloride flush  3 mL Intravenous Q12H      Subjective:   Perlie Gold was seen and examined today.  Still has left lower quadrant abdominal pain, improving but no right-sided abdominal pain.  No fevers or chills, nausea or vomiting.  Wants to try diet today.   Objective:   Vitals:   01/16/23 1952 01/17/23 0455  01/17/23 0746 01/17/23 1133  BP: 116/73 109/66 115/76 127/72  Pulse: 84 74 78 75  Resp: 16 16 18 18   Temp: 98.2 F (36.8 C) 98.1 F (36.7 C) 98 F (36.7 C) 98.1 F (36.7 C)  TempSrc: Oral Oral Oral Oral  SpO2: 98% 99% 100% 98%  Weight:      Height:       No intake or output data in the 24 hours ending 01/17/23 1200   Wt Readings from Last 3 Encounters:  01/16/23 88.5 kg  04/02/22 87.1 kg  02/23/22 90.3 kg    Physical Exam General: Alert and oriented x 3, NAD Cardiovascular: S1 S2 clear, RRR.  Respiratory: CTAB Gastrointestinal: Soft, LLQ TTP, ND, NBS  Ext: no pedal edema bilaterally Psych: Normal affect    Data Reviewed:  I have personally reviewed following labs    CBC Lab Results  Component Value Date   WBC 10.0 01/17/2023   RBC 3.57 (L) 01/17/2023   HGB 11.3 (L) 01/17/2023   HCT 33.3 (L) 01/17/2023   MCV 93.3 01/17/2023   MCH 31.7 01/17/2023   PLT 215 01/17/2023   MCHC 33.9 01/17/2023   RDW 12.4 01/17/2023   LYMPHSABS 1.8 01/16/2023   MONOABS 0.9 01/16/2023   EOSABS 0.1 01/16/2023   BASOSABS 0.0 01/16/2023     Last metabolic panel Lab Results  Component Value Date   NA 138 01/17/2023   K 3.5 01/17/2023   CL 104 01/17/2023   CO2 24 01/17/2023   BUN 8 01/17/2023   CREATININE 0.83 01/17/2023   GLUCOSE 126 (H) 01/17/2023   GFRNONAA >60 01/17/2023   GFRAA >90 03/18/2014   CALCIUM 8.9 01/17/2023   PROT 6.4 (L) 01/17/2023   ALBUMIN 3.4 (L) 01/17/2023   BILITOT 1.4 (H) 01/17/2023   ALKPHOS 81 01/17/2023   AST 74 (H) 01/17/2023   ALT 77 (H) 01/17/2023   ANIONGAP 10 01/17/2023    CBG (last 3)  No results for input(s): "GLUCAP" in the last 72 hours.    Coagulation Profile: No results for input(s): "INR", "PROTIME" in the last 168 hours.   Radiology Studies: I have personally reviewed the imaging studies  US Abdomen Limited RUQ (LIVER/GB)  Result Date: 01/17/2023 CLINICAL DATA:  Abdominal pain. EXAM: ULTRASOUND ABDOMEN LIMITED RIGHT UPPER  QUADRANT COMPARISON:  CT examination performed earlier on the same date. FINDINGS: Gallbladder: Surgically absent. Common bile duct: Diameter: 15 mm Liver: No focal lesion identified. Echogenic hepatic parenchyma concerning for hepatic steatosis. Portal vein is patent on color Doppler  imaging with normal direction of blood flow towards the liver. Mild intrahepatic biliary ductal dilatation. Other: None. IMPRESSION: 1. Dilated common bile duct measuring up to 15 mm. Mild intrahepatic biliary ductal dilatation. It may be secondary to cholecystectomy status. Correlation with liver function tests is suggested. 2. Echogenic hepatic parenchyma concerning for hepatic steatosis. Electronically Signed   By: Larose Hires D.O.   On: 01/17/2023 11:34   CT ABDOMEN PELVIS W CONTRAST  Result Date: 01/16/2023 CLINICAL DATA:  Left lower quadrant abdominal pain. History of diverticulitis. EXAM: CT ABDOMEN AND PELVIS WITH CONTRAST TECHNIQUE: Multidetector CT imaging of the abdomen and pelvis was performed using the standard protocol following bolus administration of intravenous contrast. RADIATION DOSE REDUCTION: This exam was performed according to the departmental dose-optimization program which includes automated exposure control, adjustment of the mA and/or kV according to patient size and/or use of iterative reconstruction technique. CONTRAST:  75mL OMNIPAQUE IOHEXOL 350 MG/ML SOLN COMPARISON:  03/28/2022 FINDINGS: Lower chest: The lung bases are clear of acute process. No pleural effusion or pulmonary lesions. The heart is normal in size. No pericardial effusion. The distal esophagus and aorta are unremarkable. Hepatobiliary: No hepatic lesions are identified. Slightly progressive intrahepatic and extrahepatic biliary dilatation likely related to prior cholecystectomy. Pancreas: Normal Spleen: Normal Adrenals/Urinary Tract: No significant findings. Stomach/Bowel: The stomach, duodenum, small, terminal ileum and appendix are  normal. Severe acute diverticulitis at the descending colon sigmoid colon junction region with marked wall thickening and pericolonic inflammatory changes. No evidence of perforation or abscess. Severe sigmoid colon diverticulosis. Vascular/Lymphatic: The aorta is normal in caliber. No dissection. The branch vessels are patent. The major venous structures are patent. No mesenteric or retroperitoneal mass or adenopathy. Small scattered lymph nodes are noted. Reproductive: No significant findings. Other: No free air or free fluid. Musculoskeletal: No significant bony findings. IMPRESSION: 1. Severe acute diverticulitis at the descending colon sigmoid colon junction region. No evidence of perforation or abscess. 2. Slightly progressive intrahepatic and extrahepatic biliary dilatation likely related to prior cholecystectomy. Electronically Signed   By: Rudie Meyer M.D.   On: 01/16/2023 11:53   DG Chest 2 View  Result Date: 01/16/2023 CLINICAL DATA:  Provided history: Epigastric/lower chest pain. Nausea. EXAM: CHEST - 2 VIEW COMPARISON:  Prior chest radiographs 01/29/2022 and earlier. FINDINGS: Heart size within normal limits. No appreciable airspace consolidation. No evidence of pleural effusion or pneumothorax. Widened left acromioclavicular joint. Soft tissue anchors about the right humeral head. IMPRESSION: 1. No evidence of an acute cardiopulmonary abnormality. 2. Widened left acromioclavicular joint suggesting prior AC joint injury. Electronically Signed   By: Jackey Loge D.O.   On: 01/16/2023 11:52       Kerrilyn Azbill M.D. [facility] 01/17/2023, 12:00 PM  Available via Epic secure chat 7am-7pm After 7 pm, please refer to night coverage provider listed on amion.

## 2023-01-19 DIAGNOSIS — R7401 Elevation of levels of liver transaminase levels: Secondary | ICD-10-CM | POA: Diagnosis not present

## 2023-01-19 DIAGNOSIS — K5792 Diverticulitis of intestine, part unspecified, without perforation or abscess without bleeding: Secondary | ICD-10-CM | POA: Diagnosis not present

## 2023-01-19 DIAGNOSIS — K2100 Gastro-esophageal reflux disease with esophagitis, without bleeding: Secondary | ICD-10-CM | POA: Diagnosis not present

## 2023-01-19 DIAGNOSIS — I10 Essential (primary) hypertension: Secondary | ICD-10-CM | POA: Diagnosis not present

## 2023-01-19 LAB — CBC
HCT: 31.5 % — ABNORMAL LOW (ref 36.0–46.0)
Hemoglobin: 10.9 g/dL — ABNORMAL LOW (ref 12.0–15.0)
MCH: 31.7 pg (ref 26.0–34.0)
MCHC: 34.6 g/dL (ref 30.0–36.0)
MCV: 91.6 fL (ref 80.0–100.0)
Platelets: 229 10*3/uL (ref 150–400)
RBC: 3.44 MIL/uL — ABNORMAL LOW (ref 3.87–5.11)
RDW: 12.3 % (ref 11.5–15.5)
WBC: 6.5 10*3/uL (ref 4.0–10.5)
nRBC: 0 % (ref 0.0–0.2)

## 2023-01-19 LAB — COMPREHENSIVE METABOLIC PANEL
ALT: 41 U/L (ref 0–44)
AST: 24 U/L (ref 15–41)
Albumin: 3.3 g/dL — ABNORMAL LOW (ref 3.5–5.0)
Alkaline Phosphatase: 77 U/L (ref 38–126)
Anion gap: 8 (ref 5–15)
BUN: 7 mg/dL (ref 6–20)
CO2: 24 mmol/L (ref 22–32)
Calcium: 8.8 mg/dL — ABNORMAL LOW (ref 8.9–10.3)
Chloride: 108 mmol/L (ref 98–111)
Creatinine, Ser: 0.85 mg/dL (ref 0.44–1.00)
GFR, Estimated: >60 mL/min (ref >60)
Glucose, Bld: 105 mg/dL — ABNORMAL HIGH (ref 70–99)
Potassium: 3.9 mmol/L (ref 3.5–5.1)
Sodium: 140 mmol/L (ref 135–145)
Total Bilirubin: 1.1 mg/dL (ref 0.3–1.2)
Total Protein: 5.9 g/dL — ABNORMAL LOW (ref 6.5–8.1)

## 2023-01-19 MED ORDER — AMOXICILLIN-POT CLAVULANATE 875-125 MG PO TABS
1.0000 | ORAL_TABLET | Freq: Two times a day (BID) | ORAL | Status: DC
  Administered 2023-01-19: 1 via ORAL
  Filled 2023-01-19: qty 1

## 2023-01-19 MED ORDER — AMOXICILLIN-POT CLAVULANATE 875-125 MG PO TABS: 1.0000 | ORAL_TABLET | Freq: Two times a day (BID) | ORAL | 0 refills | Status: AC

## 2023-01-19 MED ORDER — TRAMADOL HCL 50 MG PO TABS: 50.0000 mg | ORAL_TABLET | Freq: Four times a day (QID) | ORAL | 0 refills | Status: AC | PRN

## 2023-01-19 MED ORDER — ONDANSETRON HCL 4 MG PO TABS: 4.0000 mg | ORAL_TABLET | Freq: Three times a day (TID) | ORAL | 1 refills | Status: AC | PRN

## 2023-01-19 NOTE — Plan of Care (Addendum)
Problem: Education: Goal: Knowledge of disease or condition will improve Outcome: Progressing  Pt understands she was admitted into the hospital for worsening LUQ abdominal pain.  GI was consulted to further assess pt.  She is currently awaiting d/c. AVS reviewed with pt.    Problem: Activity: Goal: Ability to tolerate increased activity will improve Outcome: Progressing Pt is independent of all her ADLs.  She can ambulate in room independently.  Pt was observed OOB ambulating with a steady gait.    Problem: Respiratory: Goal: Ability to maintain a clear airway will improve Outcome: Progressing Respiratory status monitored and assessed q-shift.  Pt is on room air with O2 saturation at 99% and respiration rate of 18 breaths per minure.  Pt has not endorsed c/o SOB or DOE.    Problem: Activity: Goal: Ability to tolerate increased activity will improve Outcome: Progressing Pt is independent of all her ADLs.  She can ambulate in her room independently.  Pt was observed OOB ambulating with a steady gait.    Problem: Clinical Measurements: Goal: Will remain free from infection Outcome: Progressing S/Sx of infection monitored and assessed q-shift.  She has remained afebrile thus far.    Problem: Pain Managment: Goal: General experience of comfort will improve Outcome: Progressing Pt has endorsed c/o 2-4/10 LUQ abdominal pain describing it as a constant pressure.  Reiterated pain scale so she could adequately rate her pain.  Pt stated her pain goal this admission would be 0/10.  Discussed nonpharmacological methods to help reduce s/sx of pain.  Interventions given per pt's request and MD's orders.    Problem: Safety: Goal: Ability to remain free from injury will improve Outcome: Progressing Pt has remained free from falls thus far.  Instructed pt to utilize RN call light for assistance.  Hourly rounds performed.  Pt is independent of all her ADLs.  She can ambulate in her room independently.   Pt was observed OOB ambulating with a steady gait.  She had her yellow nonskid socks on.  Bed in lowest position, locked with two upper side rails engaged.  Belongings and call light within reach   Problem: Safety: Goal: Ability to remain free from injury will improve Outcome: Progressing Skin integrity monitored and assessed q-shift.  Instructed pt to turn/ reposition herself q2 hours to prevent further skin impairment.  Tubes and drains assessed for device related pressure sores.  Pt is continent of both her bowel and bladder.     Problem: Nutrition: Goal: Adequate nutrition will be maintained Outcome: Progressing Pt was advanced to a soft diet and was able to tolerate it well w/o s/sx of abdominal pain or n/v.   Problem: Pain Managment: Goal: General experience of comfort will improve Outcome: Progressing Pt has endorsed c/o 2-4/10 LUQ abdominal pain describing it as a constant pressure.  Reiterated pain scale so she could adequately rate her pain.  Pt stated her pain goal this admission would be 0/10.  Discussed nonpharmacological methods to help reduce s/sx of pain.  Interventions given per pt's request and MD's orders.

## 2023-01-19 NOTE — Progress Notes (Signed)
[facility] Gastroenterology Progress Note  Carol Hamilton 57 y.o. 10/30/1965   Subjective: Gurgling in abdomen after breakfast but otherwise ok. Tolerating diet.  Objective: Vital signs: Vitals:   01/19/23 0408 01/19/23 0722  BP: 132/72 (!) 143/85  Pulse: 68 67  Resp: 16 18  Temp: 97.7 F (36.5 C) 98.1 F (36.7 C)  SpO2: 100% 99%    Physical Exam: Gen: lethargic, no acute distress, well-nourished HEENT: anicteric sclera CV: RRR Chest: CTA B Abd: LLQ tenderness with minimal guarding, soft, nondistended, +BS Ext: no edema  Lab Results: Recent Labs    01/18/23 0213 01/19/23 0350  NA 138 140  K 3.5 3.9  CL 105 108  CO2 24 24  GLUCOSE 99 105*  BUN 11 7  CREATININE 0.95 0.85  CALCIUM 8.7* 8.8*   Recent Labs    01/18/23 0213 01/19/23 0350  AST 37 24  ALT 54* 41  ALKPHOS 77 77  BILITOT 1.3* 1.1  PROT 6.2* 5.9*  ALBUMIN 3.3* 3.3*   Recent Labs    01/18/23 0213 01/19/23 0350  WBC 7.6 6.5  HGB 10.8* 10.9*  HCT 31.7* 31.5*  MCV 91.1 91.6  PLT 214 229      Assessment/Plan: Diverticulitis - improving. Agree with change to oral Abx. Stable for d/c home. Defer to Dr. Ewing Schlein whether repeat colonoscopy needed this year. F/U with him in 4-6 weeks. Transaminitis - resolved.   [name] 01/19/2023, 9:50 AM  Questions please call [phone] ID: Carol Hamilton, female   DOB: Oct 14, 1965, 57 y.o.   MRN: [id number]

## 2023-01-19 NOTE — Progress Notes (Signed)
Pt to be d/c home to self care.  Reviewed AVS with pt and made her aware of her f/u appointment with GI.  Answered any pending questions.  Pt has no further questions.  Assisted pt in gathering her belongings and getting dressed.  Removed PIV which was CDI and free from s/sx of infection.  Pt walked off unit accompanied by family to visitors entrance where her ride awaited to take her home.  Pt d/c in stable condition.

## 2023-01-19 NOTE — Discharge Summary (Signed)
Physician Discharge Summary   Patient: Carol Hamilton MRN: [id number] DOB: February 09, 1966  Admit date:     01/16/2023  Discharge date: 01/19/23  Discharge Physician: Thad Ranger, MD    PCP: Joycelyn Rua, MD   Recommendations at discharge:   Continue Augmentin 875-125 mg p.o. twice daily for 11 more days to complete full course of 14 days Outpatient follow-up with gastroenterology, Dr. Ewing Schlein in 4 weeks  Discharge Diagnoses:    Acute diverticulitis   Hypertension   GERD (gastroesophageal reflux disease)   Migraines   Transaminitis  Hospital Course: Patient is a 57 year old female with HTN, endometriosis status post resection, diverticulosis, GERD, migraine, RLS presented with worsening left lower quadrant abdominal pain.  Patient was treated for episode of diverticulitis about a month ago with oral antibiotics.  Patient reported that at night before the admission she had increasing pain and low-grade fevers, continued to have significant pain with some waves of more intense pain radiating up to her abdomen. CBC showed leukocytosis to 13.5, lipase normal CT abdomen showed severe acute diverticulitis with biliary dilation in the setting of prior cholecystectomy. Patient was admitted for further workup  Assessment and Plan:   Acute diverticulitis, recurrent -Known history of diverticular disease with recurrent diverticulitis.  Was treated with oral antibiotics for acute diverticulitis. -CT abdomen showed severe acute diverticulitis with biliary drain patient in the setting of prior cholecystectomy. -Patient has been on IV Zosyn, n.p.o., IV fluids, pain control. -GI was consulted. -She is now tolerating soft diet, transitioned to oral Augmentin and has tolerated Augmentin without any difficulty.  Allergy was removed from the epic. Patient will continue oral Augmentin for 11 more days to complete full course of 14 days given severe acute diverticular disease.  She will follow-up with Dr.  Ewing Schlein, gastroenterology in 4 weeks.  Will defer to GI for outpatient colonoscopy timing.   Acute transaminitis -Prior history of cholecystectomy.  Right upper quadrant ultrasound showed dilated common bile duct measuring up to 15 mm, mild intrahepatic biliary duct dilation, may be secondary to cholecystectomy status.  Hepatic steatosis -MRCP showed intra and extrahepatic bile duct dilation with CBD 15 mm with tapering to the level of ampulla, no focal mass lesion or filling defects. -Currently no RUQ abdominal pain, likely may have passed the stone -LFTs are now normalized, no right upper quadrant abdominal pain     Hypertension -Stable, continue lisinopril       GERD (gastroesophageal reflux disease) -Continue PPI     Migraines -Continue Imitrex as needed       Estimated body mass index is 27.98 kg/m as calculated from the following:   Height as of this encounter: 5\' 10"  (1.778 m).   Weight as of this encounter: 88.5 kg.         Pain control - [employer] Controlled Substance Reporting System database was reviewed. and patient was instructed, not to drive, operate heavy machinery, perform activities at heights, swimming or participation in water activities or provide baby-sitting services while on Pain, Sleep and Anxiety Medications; until their outpatient Physician has advised to do so again. Also recommended to not to take more than prescribed Pain, Sleep and Anxiety Medications.  Consultants: GI Procedures performed: MRCP Disposition: Home Diet recommendation: Soft diet Discharge Diet Orders (From admission, onward)     Start     Ordered   01/19/23 0000  Diet - low sodium heart healthy        01/19/23 1317  DISCHARGE MEDICATION: Allergies as of 01/19/2023       Reactions   Amoxil [amoxicillin] Nausea And Vomiting   Azithromycin Swelling   Erythromycin Swelling   Flagyl [metronidazole] Nausea And Vomiting   Lactaid [tilactase] Diarrhea   Lactose  Diarrhea, Itching, Nausea And Vomiting   Adhesive [tape] Rash   Latex Rash   Sulfa Antibiotics Rash        Medication List     STOP taking these medications    doxycycline 100 MG tablet Commonly known as: VIBRA-TABS       TAKE these medications    acebutolol 200 MG capsule Commonly known as: SECTRAL Take 200 mg by mouth every morning.   acetaminophen 325 MG tablet Commonly known as: TYLENOL Take 650 mg by mouth daily as needed for mild pain.   albuterol 108 (90 Base) MCG/ACT inhaler Commonly known as: VENTOLIN HFA Inhale 2 puffs into the lungs every 6 (six) hours as needed for wheezing or shortness of breath.   amoxicillin-clavulanate 875-125 MG tablet Commonly known as: AUGMENTIN Take 1 tablet by mouth 2 (two) times daily for 11 days.   celecoxib 200 MG capsule Commonly known as: CELEBREX TAKE ONE CAPSULE BY MOUTH EVERY DAY WITH A MEAL   CENTRUM SILVER 50+WOMEN PO Take 1 tablet by mouth daily.   EPINEPHrine 0.3 mg/0.3 mL Soaj injection Commonly known as: EPI-PEN Inject 0.3 mg into the muscle as needed for anaphylaxis.   fluticasone 50 MCG/ACT nasal spray Commonly known as: FLONASE Place 1 spray into both nostrils daily.   ipratropium 0.06 % nasal spray Commonly known as: ATROVENT Place 1 spray into both nostrils daily.   lisinopril 10 MG tablet Commonly known as: ZESTRIL Take 10 mg by mouth daily.   omeprazole 40 MG capsule Commonly known as: PRILOSEC Take 40 mg by mouth daily.   ondansetron 4 MG tablet Commonly known as: Zofran Take 1 tablet (4 mg total) by mouth every 8 (eight) hours as needed for nausea or vomiting. What changed: when to take this   rizatriptan 10 MG disintegrating tablet Commonly known as: MAXALT-MLT Take 10 mg by mouth See admin instructions. 10 mg PRN migraine, may repeat 10 mg in 2 hours if needed   traMADol 50 MG tablet Commonly known as: Ultram Take 1 tablet (50 mg total) by mouth every 6 (six) hours as needed for  severe pain.        Follow-up Information     Joycelyn Rua, MD. Schedule an appointment as soon as possible for a visit in 2 week(s).   Specialty: Family Medicine Why: for hospital follow-up Contact information: [address] [place] [address] [phone]         Vida Rigger, MD. Schedule an appointment as soon as possible for a visit in 4 week(s).   Specialty: Gastroenterology Why: for hospital follow-up Contact information: [address] [place] [address] [phone]                Discharge Exam: Ceasar Mons Weights   01/16/23 0906  Weight: 88.5 kg   S: Doing better, tolerating soft diet without any difficulty, no fevers or chills, pain in the left lower quadrant much improved.   BP (!) 143/85 (BP Location: Right Arm)   Pulse 67   Temp 98.1 F (36.7 C) (Oral)   Resp 18   Ht 5\' 10"  (1.778 m)   Wt 88.5 kg   LMP 01/07/2014   SpO2 99%   BMI  27.98 kg/m   Physical Exam General: Alert and oriented x 3, NAD Cardiovascular: S1 S2 clear, RRR.  Respiratory: CTAB, no wheezing, rales or rhonchi Gastrointestinal: Soft, mild LLQ TTP, improving, nondistended, NBS Ext: no pedal edema bilaterally Neuro: no new deficits Psych: Normal affect    Condition at discharge: fair  The results of significant diagnostics from this hospitalization (including imaging, microbiology, ancillary and laboratory) are listed below for reference.   Imaging Studies: MR ABDOMEN MRCP W WO CONTAST  Result Date: 01/17/2023 CLINICAL DATA:  Epigastric and elevated liver function tests EXAM: MRI ABDOMEN WITHOUT AND WITH CONTRAST (INCLUDING MRCP) TECHNIQUE: Multiplanar multisequence MR imaging of the abdomen was performed both before and after the administration of intravenous contrast. Heavily T2-weighted images of the biliary and pancreatic ducts were obtained, and three-dimensional MRCP images were rendered by post processing. CONTRAST:  9mL GADAVIST  GADOBUTROL 1 MMOL/ML IV SOLN COMPARISON:  Ultrasound abdomen dated 01/17/2023, CT abdomen and pelvis dated 01/16/2023, 02/23/2022 FINDINGS: Lower chest: No acute findings. Hepatobiliary: Scattered subcentimeter nonenhancing foci in segment 5 (1204:68) and 6 (1204:61), likely cysts. Intra and extrahepatic bile duct dilation with common bile duct measuring up to 15 mm, similar compared to 02/23/2022. Tapering to the level of the ampulla. No focal mass lesion or filling defects. Cholecystectomy. Pancreas: No mass, inflammatory changes, or other parenchymal abnormality identified. Spleen:  Within normal limits in size and appearance. Adrenals/Urinary Tract: No adrenal nodules. No suspicious renal masses identified. No evidence of hydronephrosis. Stomach/Bowel: Colonic diverticulosis without acute diverticulitis. Vascular/Lymphatic: No pathologically enlarged lymph nodes identified. No abdominal aortic aneurysm demonstrated. Other:  None. Musculoskeletal: No suspicious bone lesions identified. IMPRESSION: Intra and extrahepatic bile duct dilation with common bile duct measuring up to 15 mm with tapering to the level of the ampulla, similar compared to 02/23/2022. No focal mass lesion or filling defects. If there is clinical concern for biliary obstruction, consider further evaluation with ERCP. Electronically Signed   By: Agustin Cree M.D.   On: 01/17/2023 21:07   US Abdomen Limited RUQ (LIVER/GB)  Result Date: 01/17/2023 CLINICAL DATA:  Abdominal pain. EXAM: ULTRASOUND ABDOMEN LIMITED RIGHT UPPER QUADRANT COMPARISON:  CT examination performed earlier on the same date. FINDINGS: Gallbladder: Surgically absent. Common bile duct: Diameter: 15 mm Liver: No focal lesion identified. Echogenic hepatic parenchyma concerning for hepatic steatosis. Portal vein is patent on color Doppler imaging with normal direction of blood flow towards the liver. Mild intrahepatic biliary ductal dilatation. Other: None. IMPRESSION: 1. Dilated  common bile duct measuring up to 15 mm. Mild intrahepatic biliary ductal dilatation. It may be secondary to cholecystectomy status. Correlation with liver function tests is suggested. 2. Echogenic hepatic parenchyma concerning for hepatic steatosis. Electronically Signed   By: Larose Hires D.O.   On: 01/17/2023 11:34   CT ABDOMEN PELVIS W CONTRAST  Result Date: 01/16/2023 CLINICAL DATA:  Left lower quadrant abdominal pain. History of diverticulitis. EXAM: CT ABDOMEN AND PELVIS WITH CONTRAST TECHNIQUE: Multidetector CT imaging of the abdomen and pelvis was performed using the standard protocol following bolus administration of intravenous contrast. RADIATION DOSE REDUCTION: This exam was performed according to the departmental dose-optimization program which includes automated exposure control, adjustment of the mA and/or kV according to patient size and/or use of iterative reconstruction technique. CONTRAST:  75mL OMNIPAQUE IOHEXOL 350 MG/ML SOLN COMPARISON:  03/28/2022 FINDINGS: Lower chest: The lung bases are clear of acute process. No pleural effusion or pulmonary lesions. The heart is normal in size. No pericardial effusion. The distal esophagus and  aorta are unremarkable. Hepatobiliary: No hepatic lesions are identified. Slightly progressive intrahepatic and extrahepatic biliary dilatation likely related to prior cholecystectomy. Pancreas: Normal Spleen: Normal Adrenals/Urinary Tract: No significant findings. Stomach/Bowel: The stomach, duodenum, small, terminal ileum and appendix are normal. Severe acute diverticulitis at the descending colon sigmoid colon junction region with marked wall thickening and pericolonic inflammatory changes. No evidence of perforation or abscess. Severe sigmoid colon diverticulosis. Vascular/Lymphatic: The aorta is normal in caliber. No dissection. The branch vessels are patent. The major venous structures are patent. No mesenteric or retroperitoneal mass or adenopathy. Small  scattered lymph nodes are noted. Reproductive: No significant findings. Other: No free air or free fluid. Musculoskeletal: No significant bony findings. IMPRESSION: 1. Severe acute diverticulitis at the descending colon sigmoid colon junction region. No evidence of perforation or abscess. 2. Slightly progressive intrahepatic and extrahepatic biliary dilatation likely related to prior cholecystectomy. Electronically Signed   By: Rudie Meyer M.D.   On: 01/16/2023 11:53   DG Chest 2 View  Result Date: 01/16/2023 CLINICAL DATA:  Provided history: Epigastric/lower chest pain. Nausea. EXAM: CHEST - 2 VIEW COMPARISON:  Prior chest radiographs 01/29/2022 and earlier. FINDINGS: Heart size within normal limits. No appreciable airspace consolidation. No evidence of pleural effusion or pneumothorax. Widened left acromioclavicular joint. Soft tissue anchors about the right humeral head. IMPRESSION: 1. No evidence of an acute cardiopulmonary abnormality. 2. Widened left acromioclavicular joint suggesting prior AC joint injury. Electronically Signed   By: Jackey Loge D.O.   On: 01/16/2023 11:52    Microbiology: Results for orders placed or performed during the hospital encounter of 02/23/22  Blood culture (routine x 2)     Status: None   Collection Time: 02/23/22  7:58 AM   Specimen: BLOOD  Result Value Ref Range Status   Specimen Description BLOOD LEFT ANTECUBITAL  Final   Special Requests   Final    BOTTLES DRAWN AEROBIC AND ANAEROBIC Blood Culture adequate volume   Culture   Final    NO GROWTH 5 DAYS Performed at [facility], [address], [place], [place] [address]    Report Status 02/28/2022 FINAL  Final  Blood culture (routine x 2)     Status: None   Collection Time: 02/23/22  8:03 AM   Specimen: BLOOD  Result Value Ref Range Status   Specimen Description BLOOD RIGHT ANTECUBITAL  Final   Special Requests   Final    BOTTLES DRAWN AEROBIC AND ANAEROBIC Blood Culture adequate volume    Culture   Final    NO GROWTH 5 DAYS Performed at [facility], [address], [place], [place] [address]    Report Status 02/28/2022 FINAL  Final    Labs: CBC: Recent Labs  Lab 01/16/23 0921 01/16/23 0948 01/17/23 0150 01/18/23 0213 01/19/23 0350  WBC 13.5*  --  10.0 7.6 6.5  NEUTROABS 10.7*  --   --   --   --   HGB 13.2 11.2* 11.3* 10.8* 10.9*  HCT 39.1 33.0* 33.3* 31.7* 31.5*  MCV 91.4  --  93.3 91.1 91.6  PLT 277  --  215 214 229   Basic Metabolic Panel: Recent Labs  Lab 01/16/23 0921 01/16/23 0948 01/17/23 0150 01/18/23 0213 01/19/23 0350  NA 138 138 138 138 140  K 3.6 3.7 3.5 3.5 3.9  CL 102 105 104 105 108  CO2 22  --  24 24 24   GLUCOSE 142* 132* 126* 99 105*  BUN 8 8 8  11  7  CREATININE 0.82 0.70 0.83 0.95 0.85  CALCIUM 9.6  --  8.9 8.7* 8.8*   Liver Function Tests: Recent Labs  Lab 01/16/23 0921 01/17/23 0150 01/18/23 0213 01/19/23 0350  AST 20 74* 37 24  ALT 21 77* 54* 41  ALKPHOS 78 81 77 77  BILITOT 1.3* 1.4* 1.3* 1.1  PROT 7.3 6.4* 6.2* 5.9*  ALBUMIN 4.2 3.4* 3.3* 3.3*   CBG: No results for input(s): "GLUCAP" in the last 168 hours.  Discharge time spent: greater than 30 minutes.  Signed: Thad Ranger, MD [facility] 01/19/2023

## 2023-11-06 ENCOUNTER — Ambulatory Visit: Admitting: Podiatry

## 2023-11-06 DIAGNOSIS — G629 Polyneuropathy, unspecified: Secondary | ICD-10-CM

## 2023-11-06 DIAGNOSIS — D3610 Benign neoplasm of peripheral nerves and autonomic nervous system, unspecified: Secondary | ICD-10-CM | POA: Diagnosis not present

## 2023-11-06 DIAGNOSIS — E569 Vitamin deficiency, unspecified: Secondary | ICD-10-CM

## 2023-11-06 MED ORDER — GABAPENTIN 100 MG PO CAPS: 100.0000 mg | ORAL_CAPSULE | Freq: Every day | ORAL | 0 refills | Status: DC

## 2023-11-06 NOTE — Progress Notes (Signed)
  Subjective:  Patient ID: Carol Hamilton, female    DOB: 1966-06-04,  MRN: [id number]  Chief Complaint  Patient presents with   Numbness    RM#11 Patient states is here to be evaluated for numbness and tingling of feet has had x-rays at primary care provider who referred over to [place].    Discussed the use of AI scribe software for clinical note transcription with the patient, who gave verbal consent to proceed.  History of Present Illness The patient, with a history of restless leg syndrome and diverticulitis, presents with bilateral foot numbness and tingling, described as 'different' but not 'totally numb.' The symptoms have been ongoing for over a year and are associated with intermittent, severe, sharp, stabbing pains that necessitate stomping and walking to alleviate. The discomfort is localized to the feet and does not radiate up the leg. The patient denies any associated swelling. The symptoms sometimes interfere with sleep and are more painful than previous experiences with restless leg syndrome. The patient has not sought any treatment for these symptoms prior to this visit. The patient also mentions frequent lower back pain.      Objective:    Physical Exam General: AAO x3, NAD  Dermatological: Skin is warm, dry and supple bilateral. There are no open sores, no preulcerative lesions, no rash or signs of infection present.  Vascular: Dorsalis Pedis artery and Posterior Tibial artery pedal pulses are 2/4 bilateral with immedate capillary fill time. There is no pain with calf compression, swelling, warmth, erythema.   Neruologic: Grossly intact via light touch bilateral.  Sensation intact with Semmes Weinstein monofilament.  Negative sign.  Musculoskeletal: She is palpable neuromas likely in the third interspace right side worse than left.  There is no area pinpoint tenderness identified at this time.  No edema or erythema.  MMT 5/5.  Gait: Unassisted, Nonantalgic.      No images are attached to the encounter.    Results RADIOLOGY Foot X-ray: Hallux metatarsophalangeal joint arthritis   Assessment:   1. Vitamin deficiency   2. Neuroma   3. Neuropathy      Plan:  Patient was evaluated and treated and all questions answered.  Assessment and Plan Assessment & Plan Peripheral neuropathy Symptoms consistent with peripheral neuropathy. Etiology unclear. Gabapentin proposed for symptom management. - Order blood work for B vitamins, thyroid function, sed rate, and CRP. - Start gabapentin 100 mg at night. - Advise follow-up with primary care for potential back issues. - If symptoms persist consider nerve conduction test or neurology evaluation.  Morton's neuroma Possible Morton's neuroma due to inflammation between third and fourth toes. Symptoms not localized, neuropathy more likely as her symptoms are more diffuse bilaterally.  Treatment suggested to assess symptom relief. - Provide metatarsal pads. - Consider steroid injection or prednisone if symptoms persist, but delay due to diverticulitis.   Return in about 4 weeks (around 12/04/2023).   Vivi Barrack DPM

## 2023-11-06 NOTE — Patient Instructions (Addendum)
 Morton Neuralgia  Morton neuralgia is foot pain that affects the ball of the foot and the area near the toes. Morton neuralgia occurs when part of a nerve in the foot (digital nerve) is under too much pressure (compressed). When this happens over a long period of time, the nerve can thicken (neuroma) and cause pain. Pain usually occurs between the third and fourth toes.  Morton neuralgia can come and go but may get worse over time. What are the causes? This condition is caused by doing the same things over and over with your foot, such as: Activities such as running or jumping. Wearing shoes that are too tight. What increases the risk? You may be at higher risk for Morton neuralgia if you: Are female. Wear high heels. Wear shoes that are narrow or tight. Do activities that repeatedly stretch your toes, such as: Running. Ballet. Long-distance walking. What are the signs or symptoms? The first symptom of Morton neuralgia is pain that spreads from the ball of the foot to the toes. It may feel like you are walking on a marble. Pain usually gets worse with walking and goes away at night. Other symptoms may include numbness and cramping of your toes. Both feet are equally affected, but rarely at the same time. How is this diagnosed? This condition is diagnosed based on your symptoms, your medical history, and a physical exam. Your health care provider may: Squeeze your foot just behind your toe. Ask you to move your toes to check for pain. Ask about your physical activity level. You also may have imaging tests, such as an X-ray, ultrasound, or MRI. How is this treated? Treatment depends on how severe your condition is and what causes it. Treatment may involve: Wearing different shoes that are not too tight, are low-heeled, and provide good support. For some people, this is the only treatment needed. Wearing an over-the-counter or custom supportive pad (orthotic) under the front of your  foot. Getting injections of numbing medicine and anti-inflammatory medicine (steroid) in the nerve. Having surgery to remove part of the thickened nerve. Follow these instructions at home: Managing pain, stiffness, and swelling  Massage your foot as needed. Wear orthotics as told by your health care provider. If directed, put ice on the painful area. To do this: Put ice in a plastic bag. Place a towel between your skin and the bag. Leave the ice on for 20 minutes, 2-3 times a day. Remove the ice if your skin turns bright red. This is very important. If you cannot feel pain, heat, or cold, you have a greater risk of damage to the area. Raise (elevate) the injured area above the level of your heart while you are sitting or lying down. Avoid activities that cause pain or make pain worse. If you play sports, ask your health care provider when it is safe for you to return to sports. General instructions Take over-the-counter and prescription medicines only as told by your health care provider. For the time period you were told by your health care provider, do not drive or use machinery. Wear shoes that: Have soft soles. Have a wide toe area. Provide arch support. Do not pinch or squeeze your feet. Have room for your orthotics, if this applies. Keep all follow-up visits. This is important. Contact a health care provider if: Your symptoms get worse or do not get better with treatment and home care. Summary Morton neuralgia is foot pain that affects the ball of the foot and the  area near the toes. Pain usually occurs between the third and fourth toes, gets worse with walking, and goes away at night. Morton neuralgia occurs when part of a nerve in the foot (digital nerve) is under too much pressure. When this happens over a long period of time, the nerve can thicken (neuroma) and cause pain. This condition is caused by doing the same things over and over with your foot, such as running or  jumping, wearing shoes that are too tight, or wearing high heels. Treatment may involve wearing low-heeled shoes that are not too tight, wearing a supportive pad (orthotic) under the front of your foot, getting injections in the nerve, or having surgery to remove part of the thickened nerve. This information is not intended to replace advice given to you by your health care provider. Make sure you discuss any questions you have with your health care provider. Document Revised: 12/22/2020 Document Reviewed: 12/22/2020 Elsevier Patient Education  2024 [employer] Inc. --- Neuropathic Pain Neuropathic pain is pain caused by damage to the nerves that are responsible for certain sensations in your body (sensory nerves). Neuropathic pain can make you more sensitive to pain. Even a minor sensation can feel very painful. This is usually a long-term (chronic) condition that can be difficult to treat. The type of pain differs from person to person. It may: Start suddenly (acute), or it may develop slowly and become chronic. Come and go as damaged nerves heal, or it may stay at the same level for years. Cause emotional distress, loss of sleep, and a lower quality of life. What are the causes? The most common cause of this condition is diabetes. Many other diseases and conditions can also cause neuropathic pain. Causes of neuropathic pain can be classified as: Toxic. This is caused by medicines and chemicals. The most common causes of toxic neuropathic pain is damage from medicines that kill cancer cells (chemotherapy) or alcohol abuse. Metabolic. This can be caused by: Diabetes. Lack of vitamins like B12. Traumatic. Any injury that cuts, crushes, or stretches a nerve can cause damage and pain. Compression-related. If a sensory nerve gets trapped or compressed for a long period of time, the blood supply to the nerve can be cut off. Vascular. Many blood vessel diseases can cause neuropathic pain by decreasing  blood supply and oxygen to nerves. Autoimmune. This type of pain results from diseases in which the body's defense system (immune system) mistakenly attacks sensory nerves. Examples of autoimmune diseases that can cause neuropathic pain include lupus and multiple sclerosis. Infectious. Many types of viral infections can damage sensory nerves and cause pain. Shingles infection is a common cause of this type of pain. Inherited. Neuropathic pain can be a symptom of many diseases that are passed down through families (genetic). What increases the risk? You are more likely to develop this condition if: You have diabetes. You smoke. You drink too much alcohol. You are taking certain medicines, including chemotherapy or medicines that treat immune system disorders. What are the signs or symptoms? The main symptom is pain. Neuropathic pain is often described as: Burning. Shock-like. Stinging. Hot or cold. Itching. How is this diagnosed? No single test can diagnose neuropathic pain. It is diagnosed based on: A physical exam and your symptoms. Your health care provider will ask you about your pain. You may be asked to use a pain scale to describe how bad your pain is. Tests. These may be done to see if you have a cause and location of  any nerve damage. They include: Nerve conduction studies and electromyography to test how well nerve signals travel through your nerves and muscles (electrodiagnostic testing). Skin biopsy to evaluate for small fiber neuropathy. Imaging studies, such as: X-rays. CT scan. MRI. How is this treated? Treatment for neuropathic pain may change over time. You may need to try different treatment options or a combination of treatments. Some options include: Treating the underlying cause of the neuropathy, such as diabetes, kidney disease, or vitamin deficiencies. Stopping medicines that can cause neuropathy, such as chemotherapy. Medicine to relieve pain. Medicines may  include: Prescription or over-the-counter pain medicine. Anti-seizure medicine. Antidepressant medicines. Pain-relieving patches or creams that are applied to painful areas of skin. A medicine to numb the area (local anesthetic), which can be injected as a nerve block. Transcutaneous nerve stimulation. This uses electrical currents to block painful nerve signals. The treatment is painless. Alternative treatments, such as: Acupuncture. Meditation. Massage. Occupational or physical therapy. Pain management programs. Counseling. Follow these instructions at home: Medicines  Take over-the-counter and prescription medicines only as told by your health care provider. Ask your health care provider if the medicine prescribed to you: Requires you to avoid driving or using machinery. Can cause constipation. You may need to take these actions to prevent or treat constipation: Drink enough fluid to keep your urine pale yellow. Take over-the-counter or prescription medicines. Eat foods that are high in fiber, such as beans, whole grains, and fresh fruits and vegetables. Limit foods that are high in fat and processed sugars, such as fried or sweet foods. Lifestyle  Have a good support system at home. Consider joining a chronic pain support group. Do not use any products that contain nicotine or tobacco. These products include cigarettes, chewing tobacco, and vaping devices, such as e-cigarettes. If you need help quitting, ask your health care provider. Do not drink alcohol. General instructions Learn as much as you can about your condition. Work closely with all your health care providers to find the treatment plan that works best for you. Ask your health care provider what activities are safe for you. Keep all follow-up visits. This is important. Contact a health care provider if: Your pain treatments are not working. You are having side effects from your medicines. You are struggling with  tiredness (fatigue), mood changes, depression, or anxiety. Get help right away if: You have thoughts of hurting yourself. Get help right away if you feel like you may hurt yourself or others, or have thoughts about taking your own life. Go to your nearest emergency room or: Call 911. Call the National Suicide Prevention Lifeline at 778-523-8301 or 988. This is open 24 hours a day. Text the Crisis Text Line at 445 245 3777. Summary Neuropathic pain is pain caused by damage to the nerves that are responsible for certain sensations in your body (sensory nerves). Neuropathic pain may come and go as damaged nerves heal, or it may stay at the same level for years. Neuropathic pain is usually a long-term condition that can be difficult to treat. Consider joining a chronic pain support group. This information is not intended to replace advice given to you by your health care provider. Make sure you discuss any questions you have with your health care provider. Document Revised: 03/12/2021 Document Reviewed: 03/12/2021 Elsevier Patient Education  2024 Elsevier Inc.  -  Gabapentin Capsules or Tablets What is this medication? GABAPENTIN (GA ba pen tin) treats nerve pain. It may also be used to prevent and control seizures in  people with epilepsy. It works by calming overactive nerves in your body. This medicine may be used for other purposes; ask your health care provider or pharmacist if you have questions. COMMON BRAND NAME(S): Active-PAC with Gabapentin, Ascencion Dike, Gralise, Neurontin What should I tell my care team before I take this medication? They need to know if you have any of these conditions: Kidney disease Lung or breathing disease Substance use disorder Suicidal thoughts, plans, or attempt by you or a family member An unusual or allergic reaction to gabapentin, other medications, foods, dyes, or preservatives Pregnant or trying to get pregnant Breastfeeding How should I use this  medication? Take this medication by mouth with a glass of water. Follow the directions on the prescription label. You can take it with or without food. If it upsets your stomach, take it with food. Take your medication at regular intervals. Do not take it more often than directed. Do not stop taking except on your care team's advice. If you are directed to break the 600 or 800 mg tablets in half as part of your dose, the extra half tablet should be used for the next dose. If you have not used the extra half tablet within 28 days, it should be thrown away. A special MedGuide will be given to you by the pharmacist with each prescription and refill. Be sure to read this information carefully each time. Talk to your care team about the use of this medication in children. While this medication may be prescribed for children as young as 3 years for selected conditions, precautions do apply. Overdosage: If you think you have taken too much of this medicine contact a poison control center or emergency room at once. NOTE: This medicine is only for you. Do not share this medicine with others. What if I miss a dose? If you miss a dose, take it as soon as you can. If it is almost time for your next dose, take only that dose. Do not take double or extra doses. What may interact with this medication? Alcohol Antihistamines for allergy, cough, and cold Certain medications for anxiety or sleep Certain medications for depression like amitriptyline, fluoxetine, sertraline Certain medications for seizures like phenobarbital, primidone Certain medications for stomach problems General anesthetics like halothane, isoflurane, methoxyflurane, propofol Local anesthetics like lidocaine, pramoxine, tetracaine Medications that relax muscles for surgery Opioid medications for pain Phenothiazines like chlorpromazine, mesoridazine, prochlorperazine, thioridazine This list may not describe all possible interactions. Give your  health care provider a list of all the medicines, herbs, non-prescription drugs, or dietary supplements you use. Also tell them if you smoke, drink alcohol, or use illegal drugs. Some items may interact with your medicine. What should I watch for while using this medication? Visit your care team for regular checks on your progress. You may want to keep a record at home of how you feel your condition is responding to treatment. You may want to share this information with your care team at each visit. You should contact your care team if your seizures get worse or if you have any new types of seizures. Do not stop taking this medication or any of your seizure medications unless instructed by your care team. Stopping your medication suddenly can increase your seizures or their severity. This medication may cause serious skin reactions. They can happen weeks to months after starting the medication. Contact your care team right away if you notice fevers or flu-like symptoms with a rash. The rash may be red  or purple and then turn into blisters or peeling of the skin. Or, you might notice a red rash with swelling of the face, lips or lymph nodes in your neck or under your arms. Wear a medical identification bracelet or chain if you are taking this medication for seizures. Carry a card that lists all your medications. This medication may affect your coordination, reaction time, or judgment. Do not drive or operate machinery until you know how this medication affects you. Sit up or stand slowly to reduce the risk of dizzy or fainting spells. Drinking alcohol with this medication can increase the risk of these side effects. Your mouth may get dry. Chewing sugarless gum or sucking hard candy, and drinking plenty of water may help. Watch for new or worsening thoughts of suicide or depression. This includes sudden changes in mood, behaviors, or thoughts. These changes can happen at any time but are more common in the  beginning of treatment or after a change in dose. Call your care team right away if you experience these thoughts or worsening depression. If you become pregnant while using this medication, you may enroll in the [place] American Antiepileptic Drug Pregnancy Registry by calling 930-350-9503. This registry collects information about the safety of antiepileptic medication use during pregnancy. What side effects may I notice from receiving this medication? Side effects that you should report to your care team as soon as possible: Allergic reactions or angioedema--skin rash, itching, hives, swelling of the face, eyes, lips, tongue, arms, or legs, trouble swallowing or breathing Rash, fever, and swollen lymph nodes Thoughts of suicide or self harm, worsening mood, feelings of depression Trouble breathing Unusual changes in mood or behavior in children after use such as difficulty concentrating, hostility, or restlessness Side effects that usually do not require medical attention (report to your care team if they continue or are bothersome): Dizziness Drowsiness Nausea Swelling of ankles, feet, or hands Vomiting This list may not describe all possible side effects. Call your doctor for medical advice about side effects. You may report side effects to FDA at 1-800-FDA-1088. Where should I keep my medication? Keep out of reach of children and pets. Store at room temperature between 15 and 30 degrees C (59 and 86 degrees F). Get rid of any unused medication after the expiration date. This medication may cause accidental overdose and death if taken by other adults, children, or pets. To get rid of medications that are no longer needed or have expired: Take the medication to a medication take-back program. Check with your pharmacy or law enforcement to find a location. If you cannot return the medication, check the label or package insert to see if the medication should be thrown out in the garbage or  flushed down the toilet. If you are not sure, ask your care team. If it is safe to put it in the trash, empty the medication out of the container. Mix the medication with cat litter, dirt, coffee grounds, or other unwanted substance. Seal the mixture in a bag or container. Put it in the trash. NOTE: This sheet is a summary. It may not cover all possible information. If you have questions about this medicine, talk to your doctor, pharmacist, or health care provider.  2024 Elsevier/Gold Standard (2022-04-30 00:00:00)

## 2023-11-12 LAB — VITAMIN B1: Vitamin B1 (Thiamine): 29 nmol/L (ref 8–30)

## 2023-11-12 LAB — VITAMIN B12: Vitamin B-12: 607 pg/mL (ref 200–1100)

## 2023-11-12 LAB — C-REACTIVE PROTEIN: CRP: 7.5 mg/L (ref <8.0)

## 2023-11-12 LAB — SEDIMENTATION RATE: Sed Rate: 22 mm/h (ref 0–30)

## 2023-11-12 LAB — TSH: TSH: 1.85 m[IU]/L (ref 0.40–4.50)

## 2023-11-12 LAB — VITAMIN B6: Vitamin B6: 31.8 ng/mL — ABNORMAL HIGH (ref 2.1–21.7)

## 2023-11-13 ENCOUNTER — Encounter: Payer: Self-pay | Admitting: Podiatry

## 2023-12-04 ENCOUNTER — Encounter: Payer: Self-pay | Admitting: Podiatry

## 2023-12-04 ENCOUNTER — Ambulatory Visit: Admitting: Podiatry

## 2023-12-04 DIAGNOSIS — D3610 Benign neoplasm of peripheral nerves and autonomic nervous system, unspecified: Secondary | ICD-10-CM | POA: Diagnosis not present

## 2023-12-04 DIAGNOSIS — G629 Polyneuropathy, unspecified: Secondary | ICD-10-CM

## 2023-12-04 NOTE — Progress Notes (Signed)
  Subjective:  Patient ID: Carol Hamilton, female    DOB: 10-17-1965,  MRN: [id number]  Chief Complaint  Patient presents with   Numbness    RM#14 Follow up on foot numbness patient states pad are helping some but not much change.     History of Present Illness 58 year old female presents the office today with above concerns.  She states that she still getting some nerve symptoms to her feet.  She states that when she brings her foot up she will get pins-and-needles of the 4th and 5th toes where she is missing the chair she will notice discomfort in regards to nerve symptoms.  She has been wearing the metatarsal pads which does help some.  She does have a history of back pain but not recently evaluated.     Objective:    Physical Exam General: AAO x3, NAD  Dermatological: Skin is warm, dry and supple bilateral. There are no open sores, no preulcerative lesions, no rash or signs of infection present.  Vascular: Dorsalis Pedis artery and Posterior Tibial artery pedal pulses are 2/4 bilateral with immedate capillary fill time. There is no pain with calf compression, swelling, warmth, erythema.   Neruologic: Grossly intact via light touch bilateral.  Sensation intact with Semmes Weinstein monofilament.  Negative sign.  Musculoskeletal: Exam unable to palpate neuromas.  There is no tenderness in the interspace and there is no area pinpoint tenderness.     Gait: Unassisted, Nonantalgic.     No images are attached to the encounter.    Results RADIOLOGY Foot X-ray: Hallux metatarsophalangeal joint arthritis   Assessment:   Neuroma, concern for neuropathy    Plan:  Patient was evaluated and treated and all questions answered.  Assessment and Plan Assessment & Plan Peripheral neuropathy -Discussed treatment options and etiologies of neuropathy.  She does have history of back pain which has not been evaluated recently.  Given the nerve symptoms to erythema have her  follow-up with neurology.  We discussed possible nerve conduction test. - B6 was elevated but otherwise blood work that I ordered was within normal limits. - Gabapentin  is not overly helpful so would hold off on this until neurology evaluation. - Discussed also with her PCP in regards to back pain to see if this is contributing to nerve symptoms in her feet.  Morton's neuroma Possible Morton's neuroma due to inflammation between third and fourth toes. Symptoms not localized, neuropathy more likely as her symptoms are more diffuse bilaterally.  Treatment suggested to assess symptom relief. - Continue metatarsal pads.   No follow-ups on file.  Charity Conch DPM   Bring foot up gets needles in the 4th/5th toes  or if sitting too long in a chair feet   Put in neuro referral

## 2023-12-25 ENCOUNTER — Other Ambulatory Visit: Payer: Self-pay | Admitting: Family Medicine

## 2023-12-25 DIAGNOSIS — S52122A Displaced fracture of head of left radius, initial encounter for closed fracture: Secondary | ICD-10-CM

## 2023-12-25 DIAGNOSIS — S52121A Displaced fracture of head of right radius, initial encounter for closed fracture: Secondary | ICD-10-CM

## 2023-12-25 DIAGNOSIS — Z780 Asymptomatic menopausal state: Secondary | ICD-10-CM

## 2023-12-25 DIAGNOSIS — Z13820 Encounter for screening for osteoporosis: Secondary | ICD-10-CM

## 2024-01-14 ENCOUNTER — Ambulatory Visit

## 2024-01-14 NOTE — Progress Notes (Signed)
 Orthotics   Patient was present and evaluated for Custom molded foot orthotics. Patient will benefit from CFO's to provide total contact to BIL MLA's helping to balance and distribute body weight more evenly across BIL feet helping to reduce plantar pressure and pain. Orthotic will also encourage FF / RF alignment  Patient was scanned today and will return for fitting upon receipt  LLD 1/4 6mm heel lift added to Left orthotic  Britton Cane CPed, CFo, CFm

## 2024-02-12 ENCOUNTER — Other Ambulatory Visit

## 2024-02-25 ENCOUNTER — Ambulatory Visit

## 2024-02-25 DIAGNOSIS — Z13820 Encounter for screening for osteoporosis: Secondary | ICD-10-CM

## 2024-02-25 DIAGNOSIS — S52122A Displaced fracture of head of left radius, initial encounter for closed fracture: Secondary | ICD-10-CM

## 2024-02-25 DIAGNOSIS — S52121A Displaced fracture of head of right radius, initial encounter for closed fracture: Secondary | ICD-10-CM

## 2024-02-25 DIAGNOSIS — Z780 Asymptomatic menopausal state: Secondary | ICD-10-CM

## 2024-03-02 ENCOUNTER — Other Ambulatory Visit

## 2024-03-16 ENCOUNTER — Ambulatory Visit (INDEPENDENT_AMBULATORY_CARE_PROVIDER_SITE_OTHER)

## 2024-03-16 DIAGNOSIS — M2141 Flat foot [pes planus] (acquired), right foot: Secondary | ICD-10-CM

## 2024-03-16 DIAGNOSIS — M2142 Flat foot [pes planus] (acquired), left foot: Secondary | ICD-10-CM

## 2024-03-16 DIAGNOSIS — D3610 Benign neoplasm of peripheral nerves and autonomic nervous system, unspecified: Secondary | ICD-10-CM | POA: Diagnosis not present

## 2024-03-16 NOTE — Progress Notes (Signed)
 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   Orthotics were dispensed and fit was satisfactory. Reviewed instructions for break-in and wear. Written instructions given to patient.  Patient will follow up as needed.   Lolita Schultze CPed, CFo, CFm

## 2024-04-26 ENCOUNTER — Ambulatory Visit (INDEPENDENT_AMBULATORY_CARE_PROVIDER_SITE_OTHER): Admitting: Neurology

## 2024-04-26 ENCOUNTER — Encounter: Payer: Self-pay | Admitting: Neurology

## 2024-04-26 VITALS — BP 144/85 | HR 82 | Resp 15 | Ht 69.0 in | Wt 204.0 lb

## 2024-04-26 DIAGNOSIS — R202 Paresthesia of skin: Secondary | ICD-10-CM | POA: Insufficient documentation

## 2024-04-26 DIAGNOSIS — G2581 Restless legs syndrome: Secondary | ICD-10-CM | POA: Diagnosis not present

## 2024-04-26 MED ORDER — PREGABALIN 75 MG PO CAPS: 75.0000 mg | ORAL_CAPSULE | Freq: Three times a day (TID) | ORAL | 5 refills | Status: AC | PRN

## 2024-04-26 NOTE — Progress Notes (Signed)
 Chief Complaint  Patient presents with   New Patient (Initial Visit)    Rm14, alone,  internal referral for Neuropathy:bilateral numbness, tingly, & pins/needles feeling ongoing for     ASSESSMENT AND PLAN  Carol Hamilton is a 58 y.o. female   Intermittent bilateral feet paresthesia Restless leg syndrome Essential tremor  Mild upper extremity action tremor otherwise normal neurological examination brisk reflex,  Laboratory evaluation showed mild elevation of A1c 6.1, mildly low red blood cell count,  Ferritin level, lab for small fiber neuropathy  Suboptimal response to gabapentin  will try Lyrica 75 mg up to 3 times a day  Return to clinic in 6 months DIAGNOSTIC DATA (LABS, IMAGING, TESTING) - I reviewed patient records, labs, notes, testing and imaging myself where available.   MEDICAL HISTORY:  Carol Hamilton, is a 58 year old female, seen in request by her podiatrist Dr.  Gershon, [place] for evaluation of bilateral feet paresthesia, restless leg, primary care is from [facility] Dr. Nanci Senior, initial evaluation was on April 26, 2024  History is obtained from the patient and review of electronic medical records. I personally reviewed pertinent available imaging films in PACS.   PMHx of  Right knee pain Diverticulitis HTN Chronic migraine Depression,  Restless leg syndrome Chronic shoulder pain Radial heads fracture on both side in May 2025,  Over the past couple years she began to notice intermittent bilateral feet toes numbness tingling, stabbing burning sensation mild itching, drive me crazy, symptoms gradually become more obvious, couple days a week she has trouble sleeping because of that that, she has to get up stamping on her feet to alleviate the symptoms, tried gabapentin  up to 100 mg 3 times a day with limited help  She denies gait abnormality, mild chronic low back pain, no significant radiating pain Long history of essential tremor,  father, paternal grandmother has tremor    MRI of the brain with without contrast from [facility] June 08, 2013 was normal MRI of cervical spine showed mild degenerative changes no significant canal foraminal narrowing  Laboratory from April 15, 2024, normal CBC hemoglobin of 13.3, with mildly decreased RBC 4.08, CMP showed mild elevation of glucose 100 otherwise was within normal limit lipid profile, LDL 139, A1c was mildly elevated 6.1,   PHYSICAL EXAM:   Vitals:   04/26/24 1055 04/26/24 1100  BP: (!) 141/92 (!) 144/85  Pulse: 82   Resp: 15   SpO2: 98%   Weight: 204 lb (92.5 kg)   Height: 5' 9 (1.753 m)      Body mass index is 30.13 kg/m.  PHYSICAL EXAMNIATION:  Gen: NAD, conversant, well nourised, well groomed                     Cardiovascular: Regular rate rhythm, no peripheral edema, warm, nontender. Eyes: Conjunctivae clear without exudates or hemorrhage Neck: Supple, no carotid bruits. Pulmonary: Clear to auscultation bilaterally   NEUROLOGICAL EXAM:  MENTAL STATUS: Speech/cognition: Awake, alert, oriented to history taking and casual conversation CRANIAL NERVES: CN II: Visual fields are full to confrontation. Pupils are round equal and briskly reactive to light. CN III, IV, VI: extraocular movement are normal. No ptosis. CN V: Facial sensation is intact to light touch CN VII: Face is symmetric with normal eye closure  CN VIII: Hearing is normal to causal conversation. CN IX, X: Phonation is normal. CN XI: Head turning and shoulder shrug are intact  MOTOR: Mild action tremor, normal strength,  REFLEXES: Reflexes are  2+ and symmetric at the biceps, triceps, knees, and ankles. Plantar responses are flexor.  SENSORY: Intact to light touch, pinprick and vibratory sensation are intact in fingers and toes.  COORDINATION: There is no trunk or limb dysmetria noted.  GAIT/STANCE: Posture is normal. Gait is steady with normal steps, base, arm swing,  and turning. Heel and toe walking are normal. Tandem gait is normal.  Romberg is absent.  REVIEW OF SYSTEMS:  Full 14 system review of systems performed and notable only for as above All other review of systems were negative.   ALLERGIES: Allergies  Allergen Reactions   Amoxicillin  Nausea And Vomiting and Nausea Only    Pt reports that she has taken and tolerated.   Amoxicillin -Pot Clavulanate Nausea And Vomiting and Nausea Only    Other Reaction(s): Not available  amoxicillin  / clavulanate  N/V/D   Azithromycin Swelling   Ciprofloxacin     Other Reaction(s): Not available   Erythromycin Swelling   Lactaid [Tilactase] Diarrhea   Lactose Diarrhea, Itching and Nausea And Vomiting   Metronidazole  Nausea And Vomiting and Nausea Only    metronidazole    Other     Other Reaction(s): Not available  Milk (substance)  Other Reaction(s): Severe diarrhea and vomiting   Wound Dressings Hives, Itching and Swelling   Adhesive [Tape] Rash   Latex Rash   Sulfa Antibiotics Rash    HOME MEDICATIONS: Current Outpatient Medications  Medication Sig Dispense Refill   acebutolol  (SECTRAL ) 200 MG capsule Take 200 mg by mouth every morning.      acetaminophen  (TYLENOL ) 325 MG tablet Take 650 mg by mouth daily as needed for mild pain.     albuterol  (VENTOLIN  HFA) 108 (90 Base) MCG/ACT inhaler Inhale 2 puffs into the lungs every 6 (six) hours as needed for wheezing or shortness of breath.     Calcium-Vitamin D (CALTRATE 600 PLUS-VIT D PO) Take 1 tablet by mouth 2 (two) times daily.     celecoxib (CELEBREX) 200 MG capsule      EPINEPHrine  0.3 mg/0.3 mL IJ SOAJ injection Inject 0.3 mg into the muscle as needed for anaphylaxis.     FLUoxetine (PROZAC) 20 MG capsule Take 20 mg by mouth daily.     fluticasone  (FLONASE ) 50 MCG/ACT nasal spray Place 1 spray into both nostrils daily.     lisinopril  (ZESTRIL ) 10 MG tablet Take 10 mg by mouth daily. (Patient taking differently: Take 20 mg by mouth  daily.)     rizatriptan  (MAXALT -MLT) 10 MG disintegrating tablet Take 10 mg by mouth See admin instructions. 10 mg PRN migraine, may repeat 10 mg in 2 hours if needed     No current facility-administered medications for this visit.    PAST MEDICAL HISTORY: Past Medical History:  Diagnosis Date   Adhesive capsulitis of left shoulder    Diverticulitis    GERD (gastroesophageal reflux disease)    Headache(784.0)    Migraines   History of endometriosis    left ovary--  s/p  lso  2012   Hypertension    Labral tear of shoulder    LEFT   Migraines    OA (osteoarthritis) of knee    RIGHT   PVC's (premature ventricular contractions)    RLS (restless legs syndrome)    Wears glasses     PAST SURGICAL HISTORY: Past Surgical History:  Procedure Laterality Date   FOOT SURGERY  child   FBO-removed needle left foot   KNEE ARTHROSCOPY WITH ANTERIOR CRUCIATE LIGAMENT (ACL) REPAIR Right  2013   KNEE ARTHROSCOPY WITH MEDIAL MENISECTOMY Right 12/16/2013   Procedure: I & D Mass with excisional biopsy right knee;  Surgeon: Lamar Collet, MD;  Location: [facility];  Service: Orthopedics;  Laterality: Right;   LAPAROSCOPIC [facility]LECYSTECTOMY  2008    [facility]   ROBOT ASSISTED LEFT SALPINGOOPHORECTOMY /  RIGHT SALPINGECTOMY  06-11-2011   SHOULDER ARTHROSCOPY W/ LABRAL REPAIR Right 2004   [name]   SHOULDER ARTHROSCOPY WITH SUBACROMIAL DECOMPRESSION, ROTATOR CUFF REPAIR AND BICEP TENDON REPAIR Left 02/03/2014   Procedure: LEFT SHOULDER ARTHROSCOPY WITH MANIPULATION UNDER ANESTHESIA/RELEASES SUBACROMIAL DECOMPRESSION/DISTAL CLAVICLE RESECTION/LABRAL DEBRIDEMENT.;  Surgeon: Lamar Collet, MD;  Location: [facility];  Service: Orthopedics;  Laterality: Left;  ANESTHESIA:  GENERAL/INTERSCALENE BLOCK    FAMILY HISTORY: Family History  Problem Relation Age of Onset   Anesthesia problems Sister    Diverticulosis Neg Hx     SOCIAL HISTORY: Social History    Socioeconomic History   Marital status: Married    Spouse name: Not on file   Number of children: Not on file   Years of education: Not on file   Highest education level: Not on file  Occupational History   Not on file  Tobacco Use   Smoking status: Never   Smokeless tobacco: Never  Substance and Sexual Activity   Alcohol use: Not Currently    Alcohol/week: 1.0 standard drink of alcohol    Types: 1 drink(s) per week    Comment: occasionally    Drug use: No   Sexual activity: Yes    Comment: Spouse with Vasectomy  Other Topics Concern   Not on file  Social History Narrative   Not on file   Social Drivers of Health   Financial Resource Strain: Not on file  Food Insecurity: Low Risk  (02/03/2024)   Received from [facility]   Hunger Vital Sign    Within the past 12 months, you worried that your food would run out before you got money to buy more: Never true    Within the past 12 months, the food you bought just didn't last and you didn't have money to get more. : Never true  Transportation Needs: No Transportation Needs (02/03/2024)   Received from [facility]    In the past 12 months, has lack of reliable transportation kept you from medical appointments, meetings, work or from getting things needed for daily living? : No  Physical Activity: Not on file  Stress: Not on file  Social Connections: Unknown (12/10/2021)   Received from [facility]   Social Network    Social Network: Not on file  Intimate Partner Violence: Unknown (11/01/2021)   Received from [facility]   HITS    Physically Hurt: Not on file    Insult or Talk Down To: Not on file    Threaten Physical Harm: Not on file    Scream or Curse: Not on file      Modena Callander, M.D. Ph.D.  [facility] [address] [place], [place] [address] Ph: [phone] Fax: [phone]  CC:  Gershon Donnice SAUNDERS, DPM [address] [place],  [place] [address]   Nanci Senior, MD

## 2024-04-28 ENCOUNTER — Ambulatory Visit: Payer: Self-pay | Admitting: Neurology

## 2024-04-28 LAB — MULTIPLE MYELOMA PANEL, SERUM
Albumin SerPl Elph-Mcnc: 3.9 g/dL (ref 2.9–4.4)
Albumin/Glob SerPl: 1.4 (ref 0.7–1.7)
Alpha 1: 0.2 g/dL (ref 0.0–0.4)
Alpha2 Glob SerPl Elph-Mcnc: 0.7 g/dL (ref 0.4–1.0)
B-Globulin SerPl Elph-Mcnc: 1.1 g/dL (ref 0.7–1.3)
Gamma Glob SerPl Elph-Mcnc: 0.8 g/dL (ref 0.4–1.8)
Globulin, Total: 2.9 g/dL (ref 2.2–3.9)
IgA/Immunoglobulin A, Serum: 180 mg/dL (ref 87–352)
IgG (Immunoglobin G), Serum: 861 mg/dL (ref 586–1602)
IgM (Immunoglobulin M), Srm: 52 mg/dL (ref 26–217)
Total Protein: 6.8 g/dL (ref 6.0–8.5)

## 2024-04-28 LAB — IRON,TIBC AND FERRITIN PANEL
Ferritin: 369 ng/mL — ABNORMAL HIGH (ref 15–150)
Iron Saturation: 31 % (ref 15–55)
Iron: 81 ug/dL (ref 27–159)
Total Iron Binding Capacity: 263 ug/dL (ref 250–450)
UIBC: 182 ug/dL (ref 131–425)

## 2024-04-28 LAB — C-REACTIVE PROTEIN: CRP: 5 mg/L (ref 0–10)

## 2024-04-28 LAB — ANA W/REFLEX IF POSITIVE: Anti Nuclear Antibody (ANA): NEGATIVE

## 2024-04-28 LAB — CK: Total CK: 59 U/L (ref 32–182)

## 2024-04-28 LAB — TSH: TSH: 1.49 u[IU]/mL (ref 0.450–4.500)

## 2024-04-28 LAB — SEDIMENTATION RATE: Sed Rate: 9 mm/h (ref 0–40)

## 2024-04-28 LAB — RPR: RPR Ser Ql: NONREACTIVE

## 2024-05-18 ENCOUNTER — Other Ambulatory Visit: Payer: Self-pay | Admitting: Gastroenterology

## 2024-07-08 ENCOUNTER — Encounter (HOSPITAL_COMMUNITY): Payer: Self-pay | Admitting: Gastroenterology

## 2024-07-08 NOTE — Progress Notes (Signed)
 Attempted to obtain medical history for pre op call via telephone, unable to reach at this time. HIPAA compliant voicemail message left requesting return call to pre surgical testing department.

## 2024-07-14 ENCOUNTER — Ambulatory Visit (HOSPITAL_COMMUNITY)
Admission: RE | Admit: 2024-07-14 | Discharge: 2024-07-14 | Disposition: A | Discharge status: Home / Self Care | Attending: Gastroenterology | Admitting: Gastroenterology

## 2024-07-14 ENCOUNTER — Other Ambulatory Visit: Payer: Self-pay

## 2024-07-14 ENCOUNTER — Ambulatory Visit (HOSPITAL_COMMUNITY): Admitting: Anesthesiology

## 2024-07-14 ENCOUNTER — Encounter (HOSPITAL_COMMUNITY): Admission: RE | Disposition: A | Payer: Self-pay | Source: Home / Self Care | Attending: Gastroenterology

## 2024-07-14 ENCOUNTER — Encounter (HOSPITAL_COMMUNITY): Payer: Self-pay | Admitting: Gastroenterology

## 2024-07-14 DIAGNOSIS — K209 Esophagitis, unspecified without bleeding: Secondary | ICD-10-CM | POA: Insufficient documentation

## 2024-07-14 DIAGNOSIS — R1011 Right upper quadrant pain: Secondary | ICD-10-CM | POA: Insufficient documentation

## 2024-07-14 DIAGNOSIS — K838 Other specified diseases of biliary tract: Secondary | ICD-10-CM | POA: Diagnosis not present

## 2024-07-14 DIAGNOSIS — K219 Gastro-esophageal reflux disease without esophagitis: Secondary | ICD-10-CM | POA: Insufficient documentation

## 2024-07-14 DIAGNOSIS — K2970 Gastritis, unspecified, without bleeding: Secondary | ICD-10-CM | POA: Diagnosis not present

## 2024-07-14 DIAGNOSIS — M1990 Unspecified osteoarthritis, unspecified site: Secondary | ICD-10-CM | POA: Diagnosis not present

## 2024-07-14 HISTORY — PX: EUS: SHX5427

## 2024-07-14 HISTORY — PX: ESOPHAGOGASTRODUODENOSCOPY: SHX5428

## 2024-07-14 SURGERY — ULTRASOUND, UPPER GI TRACT, ENDOSCOPIC
Anesthesia: Monitor Anesthesia Care

## 2024-07-14 MED ORDER — PROPOFOL 1000 MG/100ML IV EMUL
INTRAVENOUS | Status: AC
  Filled 2024-07-14: qty 100

## 2024-07-14 MED ORDER — PROPOFOL 500 MG/50ML IV EMUL
INTRAVENOUS | Status: DC | PRN
  Administered 2024-07-14: 10:00:00 150 ug/kg/min via INTRAVENOUS

## 2024-07-14 MED ORDER — SODIUM CHLORIDE 0.9 % IV SOLN
INTRAVENOUS | Status: DC
  Administered 2024-07-14: 09:00:00 500 mL via INTRAVENOUS

## 2024-07-14 MED ORDER — LIDOCAINE 2% (20 MG/ML) 5 ML SYRINGE
INTRAMUSCULAR | Status: DC | PRN
  Administered 2024-07-14: 10:00:00 100 mg via INTRAVENOUS
  Administered 2024-07-14: 10:00:00 30 mg via INTRAVENOUS

## 2024-07-14 MED ORDER — PROPOFOL 10 MG/ML IV BOLUS
INTRAVENOUS | Status: DC | PRN
  Administered 2024-07-14: 10:00:00 30 mg via INTRAVENOUS
  Administered 2024-07-14 (×2): 20 mg via INTRAVENOUS
  Administered 2024-07-14: 10:00:00 30 mg via INTRAVENOUS
  Administered 2024-07-14: 10:00:00 20 mg via INTRAVENOUS
  Administered 2024-07-14: 10:00:00 40 mg via INTRAVENOUS
  Administered 2024-07-14 (×2): 20 mg via INTRAVENOUS
  Administered 2024-07-14 (×3): 30 mg via INTRAVENOUS

## 2024-07-14 MED FILL — Propofol IV Emul 500 MG/50ML (10 MG/ML): INTRAVENOUS | Qty: 50 | Status: AC

## 2024-07-14 NOTE — Anesthesia Preprocedure Evaluation (Signed)
 Anesthesia Evaluation  Patient identified by MRN, date of birth, ID band Patient awake    Reviewed: Allergy  & Precautions, NPO status , Patient's Chart, lab work & pertinent test results  History of Anesthesia Complications Negative for: history of anesthetic complications  Airway Mallampati: II  TM Distance: >3 FB Neck ROM: Full    Dental no notable dental hx. (+) Teeth Intact   Pulmonary neg pulmonary ROS, neg sleep apnea, neg COPD, Patient abstained from smoking.Not current smoker   Pulmonary exam normal breath sounds clear to auscultation       Cardiovascular Exercise Tolerance: Good METShypertension, Pt. on medications (-) CAD and (-) Past MI (-) dysrhythmias  Rhythm:Regular Rate:Normal - Systolic murmurs    Neuro/Psych  Headaches  negative psych ROS   GI/Hepatic ,GERD  Controlled,,(+)     (-) substance abuse    Endo/Other  neg diabetes    Renal/GU negative Renal ROS     Musculoskeletal  (+) Arthritis ,    Abdominal   Peds  Hematology   Anesthesia Other Findings Past Medical History: No date: Adhesive capsulitis of left shoulder No date: Diverticulitis No date: GERD (gastroesophageal reflux disease) No date: Headache(784.0)     Comment:  Migraines No date: History of endometriosis     Comment:  left ovary--  s/p  lso  2012 No date: Hypertension No date: Labral tear of shoulder     Comment:  LEFT No date: Migraines No date: OA (osteoarthritis) of knee     Comment:  RIGHT No date: PVC's (premature ventricular contractions) No date: RLS (restless legs syndrome) No date: Wears glasses  Reproductive/Obstetrics                              Anesthesia Physical Anesthesia Plan  ASA: 2  Anesthesia Plan: MAC   Post-op Pain Management: Minimal or no pain anticipated   Induction: Intravenous  PONV Risk Score and Plan: 2 and Propofol  infusion, TIVA and Ondansetron   Airway  Management Planned: Nasal Cannula  Additional Equipment: None  Intra-op Plan:   Post-operative Plan:   Informed Consent: I have reviewed the patients History and Physical, chart, labs and discussed the procedure including the risks, benefits and alternatives for the proposed anesthesia with the patient or authorized representative who has indicated his/her understanding and acceptance.     Dental advisory given  Plan Discussed with: CRNA and Surgeon  Anesthesia Plan Comments: (Discussed risks of anesthesia with patient, including possibility of difficulty with spontaneous ventilation under anesthesia necessitating airway intervention, PONV, and rare risks such as cardiac or respiratory or neurological events, and allergic reactions. Discussed the role of CRNA in patient's perioperative care. Patient understands.)         Anesthesia Quick Evaluation

## 2024-07-14 NOTE — Transfer of Care (Signed)
 Immediate Anesthesia Transfer of Care Note  Patient: Carol Hamilton  Procedure(s) Performed: ULTRASOUND, UPPER GI TRACT, ENDOSCOPIC  Patient Location: PACU  Anesthesia Type:MAC  Level of Consciousness: sedated  Airway & Oxygen Therapy: Patient Spontanous Breathing and Patient connected to face mask oxygen  Post-op Assessment: Report given to RN and Post -op Vital signs reviewed and stable  Post vital signs: Reviewed and stable  Last Vitals:  Vitals Value Taken Time  BP    Temp    Pulse 92 07/14/24 10:26  Resp 16 07/14/24 10:26  SpO2 97 % 07/14/24 10:26  Vitals shown include unfiled device data.  Last Pain:  Vitals:   07/14/24 0849  PainSc: 0-No pain         Complications: No notable events documented.

## 2024-07-14 NOTE — Discharge Instructions (Signed)

## 2024-07-14 NOTE — Anesthesia Postprocedure Evaluation (Signed)
 Anesthesia Post Note  Patient: Carol Hamilton  Procedure(s) Performed: ULTRASOUND, UPPER GI TRACT, ENDOSCOPIC     Patient location during evaluation: Endoscopy Anesthesia Type: MAC Level of consciousness: awake and alert Pain management: pain level controlled Vital Signs Assessment: post-procedure vital signs reviewed and stable Respiratory status: spontaneous breathing, nonlabored ventilation, respiratory function stable and patient connected to nasal cannula oxygen Cardiovascular status: blood pressure returned to baseline and stable Postop Assessment: no apparent nausea or vomiting Anesthetic complications: no   No notable events documented.  Last Vitals:  Vitals:   07/14/24 1030 07/14/24 1040  BP: 108/67 128/71  Pulse: 85 76  Resp: 13 14  Temp:    SpO2: 97% 100%    Last Pain:  Vitals:   07/14/24 1040  TempSrc:   PainSc: 0-No pain                 Rome Ade

## 2024-07-14 NOTE — H&P (Signed)
 [facility] H/P Note  Chief Complaint: Dilated bile duct, right upper quadrant pain  HPI: Carol Hamilton is an 58 y.o. female.  Recurrent RUQ pain and dilated bile duct.  LFTs normal.  Past Medical History:  Diagnosis Date   Adhesive capsulitis of left shoulder    Diverticulitis    GERD (gastroesophageal reflux disease)    Headache(784.0)    Migraines   History of endometriosis    left ovary--  s/p  lso  2012   Hypertension    Labral tear of shoulder    LEFT   Migraines    OA (osteoarthritis) of knee    RIGHT   PVC's (premature ventricular contractions)    RLS (restless legs syndrome)    Wears glasses     Past Surgical History:  Procedure Laterality Date   FOOT SURGERY  child   FBO-removed needle left foot   KNEE ARTHROSCOPY WITH ANTERIOR CRUCIATE LIGAMENT (ACL) REPAIR Right 2013   KNEE ARTHROSCOPY WITH MEDIAL MENISECTOMY Right 12/16/2013   Procedure: I & D Mass with excisional biopsy right knee;  Surgeon: Lamar Collet, MD;  Location: [facility];  Service: Orthopedics;  Laterality: Right;   LAPAROSCOPIC CHOLECYSTECTOMY  2008    [facility]   ROBOT ASSISTED LEFT SALPINGOOPHORECTOMY /  RIGHT SALPINGECTOMY  06-11-2011   SHOULDER ARTHROSCOPY W/ LABRAL REPAIR Right 2004   [name]   SHOULDER ARTHROSCOPY WITH SUBACROMIAL DECOMPRESSION, ROTATOR CUFF REPAIR AND BICEP TENDON REPAIR Left 02/03/2014   Procedure: LEFT SHOULDER ARTHROSCOPY WITH MANIPULATION UNDER ANESTHESIA/RELEASES SUBACROMIAL DECOMPRESSION/DISTAL CLAVICLE RESECTION/LABRAL DEBRIDEMENT.;  Surgeon: Lamar Collet, MD;  Location: [facility];  Service: Orthopedics;  Laterality: Left;  ANESTHESIA:  GENERAL/INTERSCALENE BLOCK    Medications Prior to Admission  Medication Sig Dispense Refill   Calcium-Vitamin D (CALTRATE 600 PLUS-VIT D PO) Take 1 tablet by mouth 2 (two) times daily.     celecoxib (CELEBREX) 200 MG capsule      FLUoxetine (PROZAC) 20 MG  capsule Take 20 mg by mouth daily.     lisinopril  (ZESTRIL ) 10 MG tablet Take 10 mg by mouth daily. (Patient taking differently: Take 20 mg by mouth daily.)     pregabalin  (LYRICA ) 75 MG capsule Take 1 capsule (75 mg total) by mouth 3 (three) times daily as needed. 90 capsule 5   rizatriptan  (MAXALT -MLT) 10 MG disintegrating tablet Take 10 mg by mouth See admin instructions. 10 mg PRN migraine, may repeat 10 mg in 2 hours if needed     acebutolol  (SECTRAL ) 200 MG capsule Take 200 mg by mouth every morning.  (Patient not taking: Reported on 07/13/2024)     acetaminophen  (TYLENOL ) 325 MG tablet Take 650 mg by mouth daily as needed for mild pain.     albuterol  (VENTOLIN  HFA) 108 (90 Base) MCG/ACT inhaler Inhale 2 puffs into the lungs every 6 (six) hours as needed for wheezing or shortness of breath.     EPINEPHrine  0.3 mg/0.3 mL IJ SOAJ injection Inject 0.3 mg into the muscle as needed for anaphylaxis.     fluticasone  (FLONASE ) 50 MCG/ACT nasal spray Place 1 spray into both nostrils daily.      Allergies: Allergies[1]  Family History  Problem Relation Age of Onset   Anesthesia problems Sister    Diverticulosis Neg Hx     Social History:  reports that she has never smoked. She has never used smokeless tobacco. She reports that she does not currently use alcohol after a past usage of about 1.0 standard  drink of alcohol per week. She reports that she does not use drugs.   ROS: As per HPI, all others negative   Blood pressure (!) 167/71, pulse 92, resp. rate 12, height 5' 9 (1.753 m), weight 90.7 kg, last menstrual period 01/07/2014, SpO2 99%. General appearance: NAD, overweight HEENT:  Anicteric CV:  No tachycardia LUNGS:  No visible distress ABD:  Soft, protuberant, non-tender NEURO:  No encephalopathy  No results found for this or any previous visit (from the past 48 hours). No results found.  Assessment/Plan   Recurrent intermittent right upper quadrant abdominal pain. Dilated  bile duct, unchanged x 2 years. EGD + EUS. Risks (bleeding, infection, bowel perforation that could require surgery, sedation-related changes in cardiopulmonary systems), benefits (identification and possible treatment of source of symptoms, exclusion of certain causes of symptoms), and alternatives (watchful waiting, radiographic imaging studies, empiric medical treatment) of upper endoscopy and upper endoscopy with ultrasound and possible fine needle aspiration (EGD + EUS +/- FNA) were explained to patient/family in detail and patient wishes to proceed.   Carol Hamilton 07/14/2024, 9:49 AM     [1]  Allergies Allergen Reactions   Amoxicillin  Nausea And Vomiting and Nausea Only    Pt reports that she has taken and tolerated.   Amoxicillin -Pot Clavulanate Nausea And Vomiting and Nausea Only    Other Reaction(s): Not available  amoxicillin  / clavulanate  N/V/D   Azithromycin Swelling   Ciprofloxacin     Other Reaction(s): Not available   Erythromycin Swelling   Lactaid [Tilactase] Diarrhea   Lactose Diarrhea, Itching and Nausea And Vomiting   Metronidazole  Nausea And Vomiting and Nausea Only    metronidazole    Other     Other Reaction(s): Not available  Milk (substance)  Other Reaction(s): Severe diarrhea and vomiting   Wound Dressings Hives, Itching and Swelling   Adhesive [Tape] Rash   Latex Rash   Sulfa Antibiotics Rash

## 2024-07-14 NOTE — Op Note (Signed)
 [facility] Patient Name: Carol Hamilton Procedure Date: 07/14/2024 MRN: [id number] Attending MD: Elsie Cree , MD, [id number] Date of Birth: Nov 24, 1965 CSN: [id number] Age: 58 Admit Type: Outpatient Procedure:                Upper EUS Indications:              Common bile duct dilation (acquired) seen on MRCP,                            Abdominal pain in the right upper quadrant Providers:                Elsie Cree, MD, Darleene Bare, RN, Haskel Chris, Technician Referring MD:             Dr. Oliva Boots Medicines:                Monitored Anesthesia Care Complications:            No immediate complications. Estimated Blood Loss:     Estimated blood loss: none. Procedure:                Pre-Anesthesia Assessment:                           - Prior to the procedure, a History and Physical                            was performed, and patient medications and                            allergies were reviewed. The patient's tolerance of                            previous anesthesia was also reviewed. The risks                            and benefits of the procedure and the sedation                            options and risks were discussed with the patient.                            All questions were answered, and informed consent                            was obtained. Prior Anticoagulants: The patient has                            taken no anticoagulant or antiplatelet agents                            except for NSAID medication. ASA Grade Assessment:  II - A patient with mild systemic disease. After                            reviewing the risks and benefits, the patient was                            deemed in satisfactory condition to undergo the                            procedure.                           After obtaining informed consent, the endoscope was                            passed  under direct vision. Throughout the                            procedure, the patient's blood pressure, pulse, and                            oxygen saturations were monitored continuously. The                            [id number]-[id number] ([id number] Olympus endoscope was introduced                            through the mouth, and advanced to the second part                            of duodenum. The [id number]-[id number] ([id number] Olympus                            endosonoscope was introduced through the mouth, and                            advanced to the second part of duodenum. The upper                            EUS was accomplished without difficulty. The                            patient tolerated the procedure well. Scope In: Scope Out: Findings:      ENDOSCOPIC FINDING: :      LA Grade A (one or more mucosal breaks less than 5 mm, not extending       between tops of 2 mucosal folds) esophagitis was found at the       gastroesophageal junction.      The exam of the esophagus was otherwise normal.      Patchy mild inflammation was found in the entire examined stomach.       Biopsies were taken with a cold forceps for histology.      The exam of the stomach was otherwise normal.      The ampulla, duodenal bulb, first  portion of the duodenum and second       portion of the duodenum were normal.      ENDOSONOGRAPHIC FINDING: :      There was dilation in the common hepatic duct which measured up to 15       mm. No choledocholithiasis      There was dilation in the common bile duct which measured up to 6 mm. No       choledocholithiasis      There was no sign of significant endosonographic abnormality in the       pancreatic head, pancreatic body, pancreatic tail and uncinate process       of the pancreas.      There was no sign of significant endosonographic abnormality in the       ampulla.      Evidence of a previous cholecystectomy was identified       endosonographically. Impression:                - LA Grade A esophagitis.                           - Gastritis. Biopsied.                           - Normal ampulla, duodenal bulb, first portion of                            the duodenum and second portion of the duodenum.                           - There was dilation in the common hepatic duct                            which measured up to 15 mm.                           - There was dilation in the common bile duct which                            measured up to 6 mm.                           - There was no sign of significant pathology in the                            pancreatic head, pancreatic body, pancreatic tail                            and uncinate process of the pancreas.                           - There was no sign of significant pathology in the                            ampulla.                           -  Evidence of a cholecystectomy. No                            choledocholithiasis. No clear explanation for                            patient's intermittent RUQ pain was identified.                            Type II SOD is a consideration. Moderate Sedation:      None Recommendation:           - Discharge patient to home (via wheelchair).                           - Resume previous diet today.                           - Continue present medications.                           - Await pathology results.                           - Return to GI clinic after studies are complete. Procedure Code(s):        --- Professional ---                           [phone], Esophagogastroduodenoscopy, flexible,                            transoral; with endoscopic ultrasound examination                            limited to the esophagus, stomach or duodenum, and                            adjacent structures                           43239, Esophagogastroduodenoscopy, flexible,                            transoral; with biopsy, single or multiple Diagnosis Code(s):         --- Professional ---                           K20.90, Esophagitis, unspecified without bleeding                           K29.70, Gastritis, unspecified, without bleeding                           K83.8, Other specified diseases of biliary tract                           Z90.49, Acquired absence of other specified parts  of digestive tract                           R10.11, Right upper quadrant pain CPT copyright 2022 [employer]. All rights reserved. The codes documented in this report are preliminary and upon coder review may  be revised to meet current compliance requirements. Elsie Cree, MD 07/14/2024 10:33:00 AM This report has been signed electronically. Number of Addenda: 0

## 2024-07-15 LAB — SURGICAL PATHOLOGY

## 2024-07-16 ENCOUNTER — Encounter (HOSPITAL_COMMUNITY): Payer: Self-pay | Admitting: Gastroenterology

## 2024-11-16 ENCOUNTER — Ambulatory Visit: Admitting: Adult Health
